# Patient Record
Sex: Female | Born: 1950 | Race: White | Hispanic: No | Marital: Married | Smoking: Never smoker
Health system: Southern US, Community
[De-identification: ages and names within clinical notes are randomized; demographics above are authoritative.]

## PROBLEM LIST (undated history)

## (undated) DIAGNOSIS — J302 Other seasonal allergic rhinitis: Secondary | ICD-10-CM

## (undated) DIAGNOSIS — H269 Unspecified cataract: Secondary | ICD-10-CM

## (undated) DIAGNOSIS — R112 Nausea with vomiting, unspecified: Secondary | ICD-10-CM

## (undated) DIAGNOSIS — E785 Hyperlipidemia, unspecified: Secondary | ICD-10-CM

## (undated) DIAGNOSIS — M810 Age-related osteoporosis without current pathological fracture: Secondary | ICD-10-CM

## (undated) DIAGNOSIS — Z8551 Personal history of malignant neoplasm of bladder: Secondary | ICD-10-CM

## (undated) DIAGNOSIS — Z87442 Personal history of urinary calculi: Secondary | ICD-10-CM

## (undated) DIAGNOSIS — Z9889 Other specified postprocedural states: Secondary | ICD-10-CM

## (undated) DIAGNOSIS — S62309A Unspecified fracture of unspecified metacarpal bone, initial encounter for closed fracture: Secondary | ICD-10-CM

## (undated) DIAGNOSIS — Z8719 Personal history of other diseases of the digestive system: Secondary | ICD-10-CM

## (undated) DIAGNOSIS — Z98811 Dental restoration status: Secondary | ICD-10-CM

## (undated) DIAGNOSIS — T07XXXA Unspecified multiple injuries, initial encounter: Secondary | ICD-10-CM

## (undated) DIAGNOSIS — S52501A Unspecified fracture of the lower end of right radius, initial encounter for closed fracture: Secondary | ICD-10-CM

## (undated) DIAGNOSIS — G43909 Migraine, unspecified, not intractable, without status migrainosus: Secondary | ICD-10-CM

## (undated) HISTORY — PX: ORIF ELBOW FRACTURE: SHX5031

## (undated) HISTORY — PX: DILATION AND CURETTAGE OF UTERUS: SHX78

## (undated) HISTORY — PX: OTHER SURGICAL HISTORY: SHX169

## (undated) HISTORY — PX: ORIF FOOT FRACTURE: SHX2123

---

## 2002-10-30 HISTORY — PX: LAPAROSCOPIC SIGMOID COLECTOMY: SHX5928

## 2006-09-28 ENCOUNTER — Encounter (INDEPENDENT_AMBULATORY_CARE_PROVIDER_SITE_OTHER): Payer: Self-pay | Admitting: Specialist

## 2006-09-28 ENCOUNTER — Ambulatory Visit (HOSPITAL_BASED_OUTPATIENT_CLINIC_OR_DEPARTMENT_OTHER): Admission: RE | Admit: 2006-09-28 | Discharge: 2006-09-28 | Payer: Self-pay | Admitting: Orthopedic Surgery

## 2006-09-28 HISTORY — PX: ELBOW ARTHROSCOPY: SHX614

## 2007-06-18 ENCOUNTER — Encounter: Admission: RE | Admit: 2007-06-18 | Discharge: 2007-06-18 | Payer: Self-pay | Admitting: Obstetrics and Gynecology

## 2008-06-22 ENCOUNTER — Encounter: Admission: RE | Admit: 2008-06-22 | Discharge: 2008-06-22 | Payer: Self-pay | Admitting: Obstetrics and Gynecology

## 2009-01-07 ENCOUNTER — Encounter: Admission: RE | Admit: 2009-01-07 | Discharge: 2009-01-07 | Payer: Self-pay | Admitting: Family Medicine

## 2009-02-01 ENCOUNTER — Encounter: Payer: Self-pay | Admitting: Internal Medicine

## 2009-02-11 ENCOUNTER — Encounter: Payer: Self-pay | Admitting: Internal Medicine

## 2009-02-15 ENCOUNTER — Encounter: Payer: Self-pay | Admitting: Internal Medicine

## 2009-02-24 ENCOUNTER — Encounter: Payer: Self-pay | Admitting: Internal Medicine

## 2009-02-26 ENCOUNTER — Encounter: Payer: Self-pay | Admitting: Internal Medicine

## 2009-03-08 DIAGNOSIS — M810 Age-related osteoporosis without current pathological fracture: Secondary | ICD-10-CM | POA: Insufficient documentation

## 2009-03-09 ENCOUNTER — Ambulatory Visit: Payer: Self-pay | Admitting: Internal Medicine

## 2009-03-09 DIAGNOSIS — R0602 Shortness of breath: Secondary | ICD-10-CM | POA: Insufficient documentation

## 2009-03-09 DIAGNOSIS — R05 Cough: Secondary | ICD-10-CM

## 2009-03-09 DIAGNOSIS — R059 Cough, unspecified: Secondary | ICD-10-CM | POA: Insufficient documentation

## 2009-03-12 ENCOUNTER — Ambulatory Visit (HOSPITAL_COMMUNITY): Admission: RE | Admit: 2009-03-12 | Discharge: 2009-03-12 | Payer: Self-pay | Admitting: Internal Medicine

## 2009-03-12 ENCOUNTER — Encounter: Payer: Self-pay | Admitting: Internal Medicine

## 2009-03-24 ENCOUNTER — Telehealth: Payer: Self-pay | Admitting: Internal Medicine

## 2009-04-02 ENCOUNTER — Telehealth (INDEPENDENT_AMBULATORY_CARE_PROVIDER_SITE_OTHER): Payer: Self-pay | Admitting: *Deleted

## 2009-04-02 ENCOUNTER — Encounter (INDEPENDENT_AMBULATORY_CARE_PROVIDER_SITE_OTHER): Payer: Self-pay | Admitting: *Deleted

## 2009-04-23 ENCOUNTER — Encounter: Payer: Self-pay | Admitting: Internal Medicine

## 2009-04-23 ENCOUNTER — Ambulatory Visit (HOSPITAL_COMMUNITY): Admission: RE | Admit: 2009-04-23 | Discharge: 2009-04-23 | Payer: Self-pay | Admitting: Internal Medicine

## 2009-06-02 ENCOUNTER — Ambulatory Visit: Payer: Self-pay | Admitting: Internal Medicine

## 2009-06-03 ENCOUNTER — Telehealth: Payer: Self-pay | Admitting: Internal Medicine

## 2009-06-03 ENCOUNTER — Encounter: Payer: Self-pay | Admitting: Pulmonary Disease

## 2009-06-23 ENCOUNTER — Encounter: Admission: RE | Admit: 2009-06-23 | Discharge: 2009-06-23 | Payer: Self-pay | Admitting: Obstetrics and Gynecology

## 2009-06-26 ENCOUNTER — Emergency Department (HOSPITAL_COMMUNITY): Admission: EM | Admit: 2009-06-26 | Discharge: 2009-06-26 | Payer: Self-pay | Admitting: Emergency Medicine

## 2009-07-06 ENCOUNTER — Encounter: Admission: RE | Admit: 2009-07-06 | Discharge: 2009-07-06 | Payer: Self-pay | Admitting: Obstetrics and Gynecology

## 2009-10-02 ENCOUNTER — Emergency Department (HOSPITAL_COMMUNITY): Admission: EM | Admit: 2009-10-02 | Discharge: 2009-10-02 | Payer: Self-pay | Admitting: Family Medicine

## 2010-06-24 ENCOUNTER — Encounter: Admission: RE | Admit: 2010-06-24 | Discharge: 2010-06-24 | Payer: Self-pay | Admitting: Obstetrics and Gynecology

## 2010-10-17 ENCOUNTER — Encounter
Admission: RE | Admit: 2010-10-17 | Discharge: 2010-10-17 | Payer: Self-pay | Source: Home / Self Care | Attending: Sports Medicine | Admitting: Sports Medicine

## 2011-01-17 ENCOUNTER — Other Ambulatory Visit: Payer: Self-pay | Admitting: Family Medicine

## 2011-01-17 DIAGNOSIS — K219 Gastro-esophageal reflux disease without esophagitis: Secondary | ICD-10-CM

## 2011-01-20 ENCOUNTER — Ambulatory Visit
Admission: RE | Admit: 2011-01-20 | Discharge: 2011-01-20 | Disposition: A | Discharge status: Home / Self Care | Payer: BC Managed Care – PPO | Source: Ambulatory Visit | Attending: Family Medicine | Admitting: Family Medicine

## 2011-01-20 DIAGNOSIS — K219 Gastro-esophageal reflux disease without esophagitis: Secondary | ICD-10-CM

## 2011-03-17 NOTE — Op Note (Signed)
NAMEMERLINDA, WRUBEL NO.:  [id number]   MEDICAL RECORD NO.:  [id number]          PATIENT TYPE:  AMB   LOCATION:  DSC                          FACILITY:  [facility]   PHYSICIAN:  Artist Pais. 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   PROCEDURE:  Left elbow arthroscopy with debridement of extensor carpi  radialis brevis tendon, radial capitellar joint and removal of large  foreign body from the olecranon fossa posteriorly.   SURGEON:  Cindee Salt, M.D., and Artist Pais. Mina Marble, M.D.   ANESTHESIA:  General.   TOURNIQUET TIME:  Thirty-five minutes.   COMPLICATIONS:  No complications.   DRAINS:  No drains.   OPERATIVE REPORT:  The patient was taken to the operating suite.  After  the induction of adequate general anesthesia, she was placed in the  lateral decubitus position with the left arm up and placed into an elbow  holder.  She was then prepped and draped in the usual, sterile fashion.  At this point in time, a standard anteromedial arthroscopic portal was  established 2 cm from the mid epicondyle anterior to the intramuscular  septum.  Sharp dissection was carried down through the skin.  Blunt  dissection anterior to the intramuscular septum with a trocar placed  into the joint.  Visualization revealed significant radiocapitellar  synovitis.  Prior to this being done, an 18-gauge needle was used to  inflate the joint with normal saline to the soft spot.  Using an inside-  out technique, a lateral portal was established in the area of the  radial head.  A suction shaver was introduced through the joint.  Synovectomy and debridement was undertaken.  At this  point in time, the  extensor carpi radialis brevis tendon and capsule overlying this area  were debrided until the muscle fibers of the ECRB were seen.  This was  done off the capsule and off the epicondylar ridge.  This was done  through two accessory portals that were made using inside-out technique  in the area of the radial head.  Once this was done, a lateral posterior  portal and a direct posterior portal was established.  A scope was  introduced into the lateral posterior portal and instruments into the  direct posterior portal.  The olecranon fossa was identified and  debrided.  A large loose body was seen and removed using a combination  of graspers and a suction shaver.  The lateral gutter was also cleared  of all debris.  There were no loose  bodies.  All the wounds were irrigated.  They were closed with 4-0  nylon.  A sterile dressing of Xeroform, 4 x 4s, fluffs and a posterior  elbow splint was applied with the elbow at 90 degrees and the forearm in  neutral.  The patient tolerated the  procedure well.      Artist Pais Mina Marble, M.D.  Electronically Signed     MAW/MEDQ  D:  09/28/2006  T:  09/29/2006  Job:  [id number]

## 2011-04-06 ENCOUNTER — Ambulatory Visit
Admission: RE | Admit: 2011-04-06 | Discharge: 2011-04-06 | Disposition: A | Discharge status: Home / Self Care | Payer: BC Managed Care – PPO | Source: Ambulatory Visit | Attending: Family Medicine | Admitting: Family Medicine

## 2011-04-06 ENCOUNTER — Other Ambulatory Visit: Payer: Self-pay | Admitting: Family Medicine

## 2011-04-06 DIAGNOSIS — M546 Pain in thoracic spine: Secondary | ICD-10-CM

## 2011-06-23 ENCOUNTER — Other Ambulatory Visit: Payer: Self-pay | Admitting: Obstetrics and Gynecology

## 2011-06-23 DIAGNOSIS — Z1231 Encounter for screening mammogram for malignant neoplasm of breast: Secondary | ICD-10-CM

## 2011-07-05 ENCOUNTER — Ambulatory Visit
Admission: RE | Admit: 2011-07-05 | Discharge: 2011-07-05 | Disposition: A | Discharge status: Home / Self Care | Payer: BC Managed Care – PPO | Source: Ambulatory Visit | Attending: Obstetrics and Gynecology | Admitting: Obstetrics and Gynecology

## 2011-07-05 DIAGNOSIS — Z1231 Encounter for screening mammogram for malignant neoplasm of breast: Secondary | ICD-10-CM

## 2011-07-11 ENCOUNTER — Other Ambulatory Visit: Payer: Self-pay | Admitting: Obstetrics

## 2011-07-11 DIAGNOSIS — M810 Age-related osteoporosis without current pathological fracture: Secondary | ICD-10-CM

## 2011-07-18 ENCOUNTER — Ambulatory Visit
Admission: RE | Admit: 2011-07-18 | Discharge: 2011-07-18 | Disposition: A | Discharge status: Home / Self Care | Payer: BC Managed Care – PPO | Source: Ambulatory Visit | Attending: Obstetrics | Admitting: Obstetrics

## 2011-07-18 DIAGNOSIS — M810 Age-related osteoporosis without current pathological fracture: Secondary | ICD-10-CM

## 2011-10-20 ENCOUNTER — Other Ambulatory Visit: Payer: Self-pay | Admitting: Family Medicine

## 2011-10-20 DIAGNOSIS — J3489 Other specified disorders of nose and nasal sinuses: Secondary | ICD-10-CM

## 2011-10-25 ENCOUNTER — Ambulatory Visit
Admission: RE | Admit: 2011-10-25 | Discharge: 2011-10-25 | Disposition: A | Discharge status: Home / Self Care | Payer: BC Managed Care – PPO | Source: Ambulatory Visit | Attending: Family Medicine | Admitting: Family Medicine

## 2011-10-25 DIAGNOSIS — J3489 Other specified disorders of nose and nasal sinuses: Secondary | ICD-10-CM

## 2012-02-27 ENCOUNTER — Ambulatory Visit
Admission: RE | Admit: 2012-02-27 | Discharge: 2012-02-27 | Disposition: A | Discharge status: Home / Self Care | Payer: BC Managed Care – PPO | Source: Ambulatory Visit | Attending: Allergy and Immunology | Admitting: Allergy and Immunology

## 2012-02-27 ENCOUNTER — Other Ambulatory Visit: Payer: Self-pay | Admitting: Allergy and Immunology

## 2012-02-27 DIAGNOSIS — R059 Cough, unspecified: Secondary | ICD-10-CM

## 2012-02-27 DIAGNOSIS — R05 Cough: Secondary | ICD-10-CM

## 2012-05-29 ENCOUNTER — Other Ambulatory Visit: Payer: Self-pay | Admitting: Obstetrics and Gynecology

## 2012-05-29 DIAGNOSIS — Z1231 Encounter for screening mammogram for malignant neoplasm of breast: Secondary | ICD-10-CM

## 2012-07-08 ENCOUNTER — Ambulatory Visit
Admission: RE | Admit: 2012-07-08 | Discharge: 2012-07-08 | Disposition: A | Discharge status: Home / Self Care | Payer: BC Managed Care – PPO | Source: Ambulatory Visit | Attending: Obstetrics and Gynecology | Admitting: Obstetrics and Gynecology

## 2012-07-08 DIAGNOSIS — Z1231 Encounter for screening mammogram for malignant neoplasm of breast: Secondary | ICD-10-CM

## 2012-07-29 ENCOUNTER — Other Ambulatory Visit: Payer: Self-pay | Admitting: Family Medicine

## 2012-07-29 ENCOUNTER — Ambulatory Visit
Admission: RE | Admit: 2012-07-29 | Discharge: 2012-07-29 | Disposition: A | Discharge status: Home / Self Care | Payer: BC Managed Care – PPO | Source: Ambulatory Visit | Attending: Family Medicine | Admitting: Family Medicine

## 2012-07-29 DIAGNOSIS — R319 Hematuria, unspecified: Secondary | ICD-10-CM

## 2012-10-24 ENCOUNTER — Ambulatory Visit
Admission: RE | Admit: 2012-10-24 | Discharge: 2012-10-24 | Disposition: A | Discharge status: Home / Self Care | Payer: BC Managed Care – PPO | Source: Ambulatory Visit | Attending: Family Medicine | Admitting: Family Medicine

## 2012-10-24 ENCOUNTER — Other Ambulatory Visit: Payer: Self-pay | Admitting: Family Medicine

## 2012-10-24 DIAGNOSIS — M6520 Calcific tendinitis, unspecified site: Secondary | ICD-10-CM

## 2012-10-30 DIAGNOSIS — Z8551 Personal history of malignant neoplasm of bladder: Secondary | ICD-10-CM

## 2012-10-30 HISTORY — DX: Personal history of malignant neoplasm of bladder: Z85.51

## 2013-03-31 ENCOUNTER — Ambulatory Visit
Admission: RE | Admit: 2013-03-31 | Discharge: 2013-03-31 | Disposition: A | Discharge status: Home / Self Care | Payer: BC Managed Care – PPO | Source: Ambulatory Visit | Attending: Family Medicine | Admitting: Family Medicine

## 2013-03-31 ENCOUNTER — Other Ambulatory Visit: Payer: Self-pay | Admitting: Family Medicine

## 2013-03-31 DIAGNOSIS — R519 Headache, unspecified: Secondary | ICD-10-CM

## 2013-06-04 ENCOUNTER — Other Ambulatory Visit: Payer: Self-pay | Admitting: Urology

## 2013-06-05 ENCOUNTER — Encounter (HOSPITAL_COMMUNITY): Payer: Self-pay | Admitting: Pharmacy Technician

## 2013-06-13 ENCOUNTER — Encounter (HOSPITAL_COMMUNITY)
Admission: RE | Admit: 2013-06-13 | Discharge: 2013-06-13 | Disposition: A | Discharge status: Home / Self Care | Payer: BC Managed Care – PPO | Source: Ambulatory Visit | Attending: Urology | Admitting: Urology

## 2013-06-13 ENCOUNTER — Encounter (HOSPITAL_COMMUNITY): Payer: Self-pay

## 2013-06-13 DIAGNOSIS — Z01812 Encounter for preprocedural laboratory examination: Secondary | ICD-10-CM | POA: Insufficient documentation

## 2013-06-13 LAB — CBC
Hemoglobin: 14.4 g/dL (ref 12.0–15.0)
MCH: 30.8 pg (ref 26.0–34.0)
RBC: 4.68 MIL/uL (ref 3.87–5.11)

## 2013-06-13 NOTE — Pre-Procedure Instructions (Signed)
PT'S SLEEP STUDY REPORT ON HER CHART FROM [facility] - DONE 05/30/2012. PT'S ECHO REPORT 02/01/09, NUCLEAR STRESS TEST REPORT 02/15/09, HEART MONITOR STUDY 2010, AND CARDIOLOGY OFFICE NOTES DR. Anne Fu 02/11/09 ON PT'S CHART. EKG AND CXR NOT NEEDED PER ANESTHESIOLOGIST'S GUIDELINES.

## 2013-06-13 NOTE — Patient Instructions (Signed)
YOUR SURGERY IS SCHEDULED AT [facility]  ON:  Thursday  8/21  REPORT TO [facility] AT:  5:30 AM      PHONE # FOR SHORT STAY IS [phone]  DO NOT EAT OR DRINK ANYTHING AFTER MIDNIGHT THE NIGHT BEFORE YOUR SURGERY.  YOU MAY BRUSH YOUR TEETH, RINSE OUT YOUR MOUTH--BUT NO WATER, NO FOOD, NO CHEWING GUM, NO MINTS, NO CANDIES, NO CHEWING TOBACCO.  PLEASE TAKE THE FOLLOWING MEDICATIONS THE AM OF YOUR SURGERY WITH A FEW SIPS OF WATER:  NO MEDICINES TO TAKE   DO NOT BRING VALUABLES, MONEY, CREDIT CARDS.  DO NOT WEAR JEWELRY, MAKE-UP, NAIL POLISH AND NO METAL PINS OR CLIPS IN YOUR HAIR. CONTACT LENS, DENTURES / PARTIALS, GLASSES SHOULD NOT BE WORN TO SURGERY AND IN MOST CASES-HEARING AIDS WILL NEED TO BE REMOVED.  BRING YOUR GLASSES CASE, ANY EQUIPMENT NEEDED FOR YOUR CONTACT LENS. FOR PATIENTS ADMITTED TO THE HOSPITAL--CHECK OUT TIME THE DAY OF DISCHARGE IS 11:00 AM.  ALL INPATIENT ROOMS ARE PRIVATE - WITH BATHROOM, TELEPHONE, TELEVISION AND WIFI INTERNET.  IF YOU ARE BEING DISCHARGED THE SAME DAY OF YOUR SURGERY--YOU CAN NOT DRIVE YOURSELF HOME--AND SHOULD NOT GO HOME ALONE BY TAXI OR BUS.  NO DRIVING OR OPERATING MACHINERY FOR 24 HOURS FOLLOWING ANESTHESIA / PAIN MEDICATIONS.  PLEASE MAKE ARRANGEMENTS FOR SOMEONE TO BE WITH YOU AT HOME THE FIRST 24 HOURS AFTER SURGERY. RESPONSIBLE DRIVER'S NAME  HARRY Venturini - PT'S HUSBAND                                               PHONE #  [phone]                             FAILURE TO FOLLOW THESE INSTRUCTIONS MAY RESULT IN THE CANCELLATION OF YOUR SURGERY.   PATIENT SIGNATURE_________________________________

## 2013-06-18 NOTE — Anesthesia Preprocedure Evaluation (Addendum)
Anesthesia Evaluation  Patient identified by MRN, date of birth, ID band Patient awake    Reviewed: Allergy & Precautions, H&P , NPO status , Patient's Chart, lab work & pertinent test results  Airway Mallampati: II TM Distance: >3 FB Neck ROM: Full    Dental  (+) Teeth Intact and Dental Advisory Given   Pulmonary shortness of breath,  breath sounds clear to auscultation  Pulmonary exam normal       Cardiovascular negative cardio ROS  Rate:Normal     Neuro/Psych  Headaches, negative psych ROS   GI/Hepatic negative GI ROS, Neg liver ROS,   Endo/Other  negative endocrine ROS  Renal/GU negative Renal ROS  negative genitourinary   Musculoskeletal negative musculoskeletal ROS (+)   Abdominal   Peds negative pediatric ROS (+)  Hematology negative hematology ROS (+)   Anesthesia Other Findings   Reproductive/Obstetrics                          Anesthesia Physical Anesthesia Plan  ASA: II  Anesthesia Plan: General   Post-op Pain Management:    Induction: Intravenous  Airway Management Planned: Oral ETT  Additional Equipment:   Intra-op Plan:   Post-operative Plan: Extubation in OR  Informed Consent: I have reviewed the patients History and Physical, chart, labs and discussed the procedure including the risks, benefits and alternatives for the proposed anesthesia with the patient or authorized representative who has indicated his/her understanding and acceptance.     Plan Discussed with: CRNA  Anesthesia Plan Comments:         Anesthesia Quick Evaluation

## 2013-06-19 ENCOUNTER — Encounter (HOSPITAL_COMMUNITY): Payer: Self-pay | Admitting: *Deleted

## 2013-06-19 ENCOUNTER — Ambulatory Visit (HOSPITAL_COMMUNITY): Payer: BC Managed Care – PPO

## 2013-06-19 ENCOUNTER — Encounter (HOSPITAL_COMMUNITY)
Admission: RE | Disposition: A | Discharge status: Home / Self Care | Payer: Self-pay | Source: Ambulatory Visit | Attending: Urology

## 2013-06-19 ENCOUNTER — Ambulatory Visit (HOSPITAL_COMMUNITY)
Admission: RE | Admit: 2013-06-19 | Discharge: 2013-06-19 | Disposition: A | Discharge status: Home / Self Care | Payer: BC Managed Care – PPO | Source: Ambulatory Visit | Attending: Urology | Admitting: Urology

## 2013-06-19 ENCOUNTER — Ambulatory Visit (HOSPITAL_COMMUNITY): Payer: BC Managed Care – PPO | Admitting: Anesthesiology

## 2013-06-19 ENCOUNTER — Encounter (HOSPITAL_COMMUNITY): Payer: Self-pay | Admitting: Anesthesiology

## 2013-06-19 DIAGNOSIS — Z9049 Acquired absence of other specified parts of digestive tract: Secondary | ICD-10-CM | POA: Insufficient documentation

## 2013-06-19 DIAGNOSIS — E7800 Pure hypercholesterolemia, unspecified: Secondary | ICD-10-CM | POA: Insufficient documentation

## 2013-06-19 DIAGNOSIS — Z79899 Other long term (current) drug therapy: Secondary | ICD-10-CM | POA: Insufficient documentation

## 2013-06-19 DIAGNOSIS — Z883 Allergy status to other anti-infective agents status: Secondary | ICD-10-CM | POA: Insufficient documentation

## 2013-06-19 DIAGNOSIS — C679 Malignant neoplasm of bladder, unspecified: Secondary | ICD-10-CM | POA: Insufficient documentation

## 2013-06-19 DIAGNOSIS — Z885 Allergy status to narcotic agent status: Secondary | ICD-10-CM | POA: Insufficient documentation

## 2013-06-19 DIAGNOSIS — M47817 Spondylosis without myelopathy or radiculopathy, lumbosacral region: Secondary | ICD-10-CM | POA: Insufficient documentation

## 2013-06-19 DIAGNOSIS — G43909 Migraine, unspecified, not intractable, without status migrainosus: Secondary | ICD-10-CM | POA: Insufficient documentation

## 2013-06-19 DIAGNOSIS — D494 Neoplasm of unspecified behavior of bladder: Secondary | ICD-10-CM

## 2013-06-19 DIAGNOSIS — Z882 Allergy status to sulfonamides status: Secondary | ICD-10-CM | POA: Insufficient documentation

## 2013-06-19 DIAGNOSIS — N35919 Unspecified urethral stricture, male, unspecified site: Secondary | ICD-10-CM | POA: Insufficient documentation

## 2013-06-19 HISTORY — PX: TRANSURETHRAL RESECTION OF BLADDER TUMOR WITH GYRUS (TURBT-GYRUS): SHX6458

## 2013-06-19 HISTORY — PX: CYSTOSCOPY W/ RETROGRADES: SHX1426

## 2013-06-19 SURGERY — TRANSURETHRAL RESECTION OF BLADDER TUMOR WITH GYRUS (TURBT-GYRUS)
Anesthesia: General | Wound class: Clean Contaminated

## 2013-06-19 MED ORDER — BELLADONNA ALKALOIDS-OPIUM 16.2-60 MG RE SUPP
RECTAL | Status: AC
  Filled 2013-06-19: qty 1

## 2013-06-19 MED ORDER — BELLADONNA ALKALOIDS-OPIUM 16.2-60 MG RE SUPP
RECTAL | Status: DC | PRN
  Administered 2013-06-19: 1 via RECTAL

## 2013-06-19 MED ORDER — DIATRIZOATE MEGLUMINE 30 % UR SOLN
URETHRAL | Status: DC | PRN
  Administered 2013-06-19: 300 mL via URETHRAL

## 2013-06-19 MED ORDER — SODIUM CHLORIDE 0.9 % IR SOLN
Status: DC | PRN
  Administered 2013-06-19: 6000 mL via INTRAVESICAL

## 2013-06-19 MED ORDER — FENTANYL CITRATE 0.05 MG/ML IJ SOLN
INTRAMUSCULAR | Status: AC
  Filled 2013-06-19: qty 2

## 2013-06-19 MED ORDER — CEPHALEXIN 500 MG PO CAPS: 500.0000 mg | ORAL_CAPSULE | Freq: Three times a day (TID) | ORAL | Status: DC

## 2013-06-19 MED ORDER — LIDOCAINE HCL 2 % EX GEL
CUTANEOUS | Status: DC | PRN
  Administered 2013-06-19: 1 via URETHRAL

## 2013-06-19 MED ORDER — MIDAZOLAM HCL 5 MG/5ML IJ SOLN
INTRAMUSCULAR | Status: DC | PRN
  Administered 2013-06-19: 2 mg via INTRAVENOUS

## 2013-06-19 MED ORDER — IOHEXOL 300 MG/ML  SOLN
INTRAMUSCULAR | Status: DC | PRN
  Administered 2013-06-19: 10 mL

## 2013-06-19 MED ORDER — PHENAZOPYRIDINE HCL 100 MG PO TABS
100.0000 mg | ORAL_TABLET | Freq: Three times a day (TID) | ORAL | Status: DC
  Administered 2013-06-19: 100 mg via ORAL
  Filled 2013-06-19 (×5): qty 1

## 2013-06-19 MED ORDER — ONDANSETRON HCL 4 MG/2ML IJ SOLN
INTRAMUSCULAR | Status: DC | PRN
  Administered 2013-06-19: 4 mg via INTRAVENOUS

## 2013-06-19 MED ORDER — SENNOSIDES-DOCUSATE SODIUM 8.6-50 MG PO TABS: 1.0000 | ORAL_TABLET | Freq: Two times a day (BID) | ORAL | Status: DC

## 2013-06-19 MED ORDER — HYOSCYAMINE SULFATE 0.125 MG PO TABS: 0.1250 mg | ORAL_TABLET | ORAL | Status: DC | PRN

## 2013-06-19 MED ORDER — CEFAZOLIN SODIUM-DEXTROSE 2-3 GM-% IV SOLR
INTRAVENOUS | Status: AC
  Filled 2013-06-19: qty 50

## 2013-06-19 MED ORDER — IOHEXOL 300 MG/ML  SOLN
INTRAMUSCULAR | Status: AC
  Filled 2013-06-19: qty 1

## 2013-06-19 MED ORDER — CEFAZOLIN SODIUM-DEXTROSE 2-3 GM-% IV SOLR
2.0000 g | INTRAVENOUS | Status: AC
  Administered 2013-06-19: 2 g via INTRAVENOUS

## 2013-06-19 MED ORDER — FENTANYL CITRATE 0.05 MG/ML IJ SOLN
25.0000 ug | INTRAMUSCULAR | Status: DC | PRN
  Administered 2013-06-19 (×4): 25 ug via INTRAVENOUS

## 2013-06-19 MED ORDER — PHENAZOPYRIDINE HCL 100 MG PO TABS: 100.0000 mg | ORAL_TABLET | Freq: Three times a day (TID) | ORAL | Status: DC | PRN

## 2013-06-19 MED ORDER — OXYCODONE-ACETAMINOPHEN 5-325 MG PO TABS: 1.0000 | ORAL_TABLET | ORAL | Status: DC | PRN

## 2013-06-19 MED ORDER — FENTANYL CITRATE 0.05 MG/ML IJ SOLN
INTRAMUSCULAR | Status: DC | PRN
  Administered 2013-06-19 (×4): 50 ug via INTRAVENOUS

## 2013-06-19 MED ORDER — LIDOCAINE HCL 2 % EX GEL
CUTANEOUS | Status: AC
  Filled 2013-06-19: qty 10

## 2013-06-19 MED ORDER — PROPOFOL 10 MG/ML IV BOLUS
INTRAVENOUS | Status: DC | PRN
  Administered 2013-06-19: 120 mg via INTRAVENOUS

## 2013-06-19 MED ORDER — OXYCODONE-ACETAMINOPHEN 5-325 MG PO TABS
1.0000 | ORAL_TABLET | ORAL | Status: DC | PRN
  Administered 2013-06-19: 1 via ORAL
  Filled 2013-06-19: qty 1

## 2013-06-19 MED ORDER — NEOSTIGMINE METHYLSULFATE 1 MG/ML IJ SOLN
INTRAMUSCULAR | Status: DC | PRN
  Administered 2013-06-19: 3 mg via INTRAVENOUS

## 2013-06-19 MED ORDER — OXYBUTYNIN CHLORIDE 5 MG PO TABS: 5.0000 mg | ORAL_TABLET | Freq: Four times a day (QID) | ORAL | Status: DC | PRN

## 2013-06-19 MED ORDER — LACTATED RINGERS IV SOLN: INTRAVENOUS | Status: DC

## 2013-06-19 MED ORDER — SUCCINYLCHOLINE CHLORIDE 20 MG/ML IJ SOLN
INTRAMUSCULAR | Status: DC | PRN
  Administered 2013-06-19: 100 mg via INTRAVENOUS

## 2013-06-19 MED ORDER — LIDOCAINE HCL (CARDIAC) 20 MG/ML IV SOLN
INTRAVENOUS | Status: DC | PRN
  Administered 2013-06-19: 100 mg via INTRAVENOUS

## 2013-06-19 MED ORDER — GLYCOPYRROLATE 0.2 MG/ML IJ SOLN
INTRAMUSCULAR | Status: DC | PRN
  Administered 2013-06-19: 0.4 mg via INTRAVENOUS

## 2013-06-19 MED ORDER — ROCURONIUM BROMIDE 100 MG/10ML IV SOLN
INTRAVENOUS | Status: DC | PRN
  Administered 2013-06-19: 20 mg via INTRAVENOUS

## 2013-06-19 MED ORDER — DEXAMETHASONE SODIUM PHOSPHATE 10 MG/ML IJ SOLN
INTRAMUSCULAR | Status: DC | PRN
  Administered 2013-06-19: 10 mg via INTRAVENOUS

## 2013-06-19 MED ORDER — LACTATED RINGERS IV SOLN
INTRAVENOUS | Status: DC | PRN
  Administered 2013-06-19 (×2): via INTRAVENOUS

## 2013-06-19 MED ORDER — PROMETHAZINE HCL 25 MG/ML IJ SOLN: 6.2500 mg | INTRAMUSCULAR | Status: DC | PRN

## 2013-06-19 SURGICAL SUPPLY — 18 items
BAG URINE DRAINAGE (UROLOGICAL SUPPLIES) ×3 IMPLANT
BAG URO CATCHER STRL LF (DRAPE) ×3 IMPLANT
BASKET ZERO TIP NITINOL 2.4FR (BASKET) IMPLANT
CATH FOLEY 2WAY SLVR  5CC 20FR (CATHETERS) ×1
CATH FOLEY 2WAY SLVR 5CC 20FR (CATHETERS) ×2 IMPLANT
CATH URET 5FR 28IN OPEN ENDED (CATHETERS) ×3 IMPLANT
CLOTH BEACON ORANGE TIMEOUT ST (SAFETY) ×3 IMPLANT
DRAPE CAMERA CLOSED 9X96 (DRAPES) ×3 IMPLANT
ELECT LOOP MED HF 24F 12D CBL (CLIP) ×3 IMPLANT
GLOVE BIOGEL M 7.0 STRL (GLOVE) IMPLANT
GOWN STRL NON-REIN LRG LVL3 (GOWN DISPOSABLE) ×3 IMPLANT
GUIDEWIRE ANG ZIPWIRE 038X150 (WIRE) IMPLANT
GUIDEWIRE STR DUAL SENSOR (WIRE) ×3 IMPLANT
MANIFOLD NEPTUNE II (INSTRUMENTS) ×3 IMPLANT
PACK CYSTO (CUSTOM PROCEDURE TRAY) ×3 IMPLANT
SCRUB PCMX 4 OZ (MISCELLANEOUS) IMPLANT
SYRINGE IRR TOOMEY STRL 70CC (SYRINGE) ×3 IMPLANT
TUBING CONNECTING 10 (TUBING) ×3 IMPLANT

## 2013-06-19 NOTE — Op Note (Signed)
Urology Operative Report  Date of Procedure: 06/19/13  Surgeon: Natalia Leatherwood, MD Assistant:  None  Preoperative Diagnosis: Bladder tumor. Postoperative Diagnosis:  Same  Procedure(s): Transurethral resection of bladder tumor (small, <2 cm), multiple. Bilateral retrograde pyelograms. Gravity cystogram with interpretation. Urethral calibration/dilation. Cystoscopy.  Estimated blood loss: Minimal  Specimen: Bladder tumor specimen sent along with a deeper biopsy of the bladder wall.  Drains: 20 French Foley catheter  Complications: None  Findings: Multiple bladder tumors throughout the bladder mostly involving the right bladder wall and right bladder floor. There were over 15 different tumors. The largest was less than 2 cm in size.  Bilateral retrograde pyelogram's negative for filling defects.  On table cystogram revealed extraperitoneal extravasation indicating a small extra peritoneal bladder perforation.  History of present illness: 62 year-old female presents today for transurethral resection of bladder tumor found during workup for gross hematuria. This is her first episode of bladder tumors.   Procedure in detail: After informed consent was obtained, the patient was taken to the operating room. They were placed in the supine position. SCDs were turned on and in place. IV antibiotics were infused, and general anesthesia was induced. A timeout was performed in which the correct patient, surgical site, and procedure were identified and agreed upon by the team.  The patient was placed in a dorsolithotomy position, making sure to pad all pertinent neurovascular pressure points. A belladonna and opium suppository was placed into the rectum. The genitals were prepped and draped in the usual sterile fashion.  Attempt to place a rigid cystoscope was unsuccessful due to stenosis of the urethra. This had been encountered and the clinic previously. There is no physiologic  obstruction. I then used female urethral sounds beginning at 35 [place] and dilating up to 80 [place] with ease. A rigid cystoscope was then advanced the urethra and into the bladder. The bladder was fully distended and evaluated in a systematic fashion with a 30 and 70 lens. There were noted to be bladder tumors mostly involving the right lateral bladder wall and bladder floor. There is also some bladder tumor located at the bladder neck at the 8 to 9:00 position. Most of these bladder tumors were small. The largest was less than 2 cm in size.  Attention was turned to the right and left ureter orifices. These were free of tumor. They were cannulated with a 5 French ureter catheter and I injected contrast to obtain retrograde pyelograms. There was no dilation, filling defects, and it was indeed out well on delayed films.  I then removed the cystoscope and inserted the visual obturator to the gyrus resectoscope. I carried out resection in normal saline. I systematically removed the bladder tumors and sent these in 2 separate specimens for pathology. Next I used a cold cup biopsy forcep to sample the deeper tissue to obtain muscle specimen. Because I obtained deeper tissue I felt that it would be most appropriate to obtain a cystogram to ensure there is no extravasation.  The tumor at the bladder neck was very small and punctate and this was fulgurated with the gyrus.  Bladder tumor chips were removed and then I placed 10 cc of lidocaine jelly into the urethra. I then inserted a 20 French Foley catheter and inflated the balloon with 10 cc of water. 3 contrast films were taken which showed no contrast in the bladder. I then used gravity filled the bladder with over 400 cc of Cystografin mixed with normal saline. A second film was taken  which showed no extravasation of contrast around the bladder. The bladder was then drained and gently irrigated with normal saline. A third film was obtained which showed some  remaining contrast most likely from a small extraperitoneal perforation. Because of this I elected to leave the catheter in place.  She's placed back in a supine position, anesthesia was reversed, and she was taken to the PACU in stable condition.  She will be given antibiotics to take all her catheter is in place and she will have to cystogram and followup.  All counts were correct at the end of the case.

## 2013-06-19 NOTE — Progress Notes (Signed)
Foley connected to leg bag and patient and husband instructed on use

## 2013-06-19 NOTE — Transfer of Care (Signed)
Immediate Anesthesia Transfer of Care Note  Patient: [name]  Procedure(s) Performed: Procedure(s): TRANSURETHRAL RESECTION OF BLADDER TUMOR WITH GYRUS (TURBT-GYRUS) (N/A) CYSTOSCOPY WITH RETROGRADE PYELOGRAM (Bilateral)  Patient Location: PACU  Anesthesia Type:General  Level of Consciousness: sedated  Airway & Oxygen Therapy: Patient Spontanous Breathing and Patient connected to face mask oxygen  Post-op Assessment: Report given to PACU RN and Post -op Vital signs reviewed and stable  Post vital signs: Reviewed and stable  Complications: No apparent anesthesia complications

## 2013-06-19 NOTE — Anesthesia Postprocedure Evaluation (Signed)
Anesthesia Post Note  Patient: [name] Kitchen  Procedure(s) Performed: Procedure(s) (LRB): TRANSURETHRAL RESECTION OF BLADDER TUMOR WITH GYRUS (TURBT-GYRUS) (N/A) CYSTOSCOPY WITH RETROGRADE PYELOGRAM (Bilateral)  Anesthesia type: General  Patient location: PACU  Post pain: Pain level controlled  Post assessment: Post-op Vital signs reviewed  Last Vitals:  Filed Vitals:   06/19/13 1019  BP: 135/79  Pulse: 43  Temp: 36.4 C  Resp: 12    Post vital signs: Reviewed  Level of consciousness: sedated  Complications: No apparent anesthesia complications

## 2013-06-19 NOTE — H&P (Signed)
Urology History and Physical Exam  CC: Bladder tumors.  HPI:  62 year old female presents today for a bladder tumor.  This was discovered during workup for gross hematuria.  She had a CT hematuria protocol 05/30/13 which revealed thickening of the posterior bladder wall.  Cystoscopy revealed several tumors in her bladder.  There were at least 4-5 smaller tumors with one of them being at least 1 cm in size.  It is possible that these could be larger.  It was difficult to fully visualize the patient's right ureteral orifice due to discomfort from cystoscopy.  The tumors were papillary in nature.  This is her first episode of bladder tumors.  She presents today for cystoscopy, transurethral resection of bladder tumor, and bilateral retrograde pyelograms.  We have discussed the risks, benefits, alternatives, and likelihood of achieving goals.  UA 06/02/13 was negative for signs of infection.  PMH: Past Medical History  Diagnosis Date  . Elevated cholesterol   . Bladder tumor     HEMATURIA - UROLOGICAL WORK UP - FOUND TO HAVE BLADDER TUMORS AND KIDNEY STONE  . Headache(784.0)     MIGRAINES  . Arthritis     LUMBAR SPINE / BACK PAIN    PSH: Past Surgical History  Procedure Laterality Date  . C-sections      X 1  . Orif left elbow    . 2nd left elbow surgery to remove bone chips    . Left shoulder surgery to repair torn ligament    . Orif left foot    . Colon surgery      SIGMOID COLECTOMY  FOR DIVERTICULITS  . Blepharoplasty - bilateral    . Dilation and curettage of uterus      Allergies: Allergies  Allergen Reactions  . Hydrocodone-Acetaminophen     REACTION: nightmares  . Moxifloxacin     REACTION: rash, itching  . Pseudoephedrine     REACTION: makes heart race  . Sulfonamide Derivatives     REACTION: hives    Medications: Prescriptions prior to admission  Medication Sig Dispense Refill  . atorvastatin (LIPITOR) 10 MG tablet Take 10 mg by mouth every other day.      .  ibuprofen (ADVIL,MOTRIN) 200 MG tablet Take 400 mg by mouth every 6 (six) hours as needed for pain.      Marland Kitchen PRESCRIPTION MEDICATION PT TAKES ALLERGY SHOTS ONCE A WEEK AT [facility]      . zolpidem (AMBIEN) 10 MG tablet Take 5 mg by mouth at bedtime.         Social History: History   Social History  . Marital Status: Married    Spouse Name: N/A    Number of Children: N/A  . Years of Education: N/A   Occupational History  . Not on file.   Social History Main Topics  . Smoking status: Never Smoker   . Smokeless tobacco: Never Used  . Alcohol Use: No  . Drug Use: No  . Sexual Activity: Not on file   Other Topics Concern  . Not on file   Social History Narrative  . No narrative on file    Family History: History reviewed. No pertinent family history.  Review of Systems: Positive: None. Negative: Chest pain, nausea, or fever.  A further 10 point review of systems was negative except what is listed in the HPI.  Physical Exam: Filed Vitals:   06/19/13 0532  BP: 112/73  Pulse: 58  Temp: 97.4 F (36.3 C)  Resp:  16    General: No acute distress.  Awake. Head:  Normocephalic.  Atraumatic. ENT:  EOMI.  Mucous membranes moist Neck:  Supple.  No lymphadenopathy. CV:  S1 present. S2 present. Regular rate. Pulmonary: Equal effort bilaterally.  Clear to auscultation bilaterally. Abdomen: Soft.  Non- tender to palpation. Skin:  Normal turgor.  No visible rash. Extremity: No gross deformity of bilateral upper extremities.  No gross deformity of    bilateral lower extremities. Neurologic: Alert. Appropriate mood.    Studies:  No results found for this basename: HGB, WBC, PLT,  in the last 72 hours  No results found for this basename: NA, K, CL, CO2, BUN, CREATININE, CALCIUM, MAGNESIUM, GFRNONAA, GFRAA,  in the last 72 hours   No results found for this basename: PT, INR, APTT,  in the last 72 hours   No components found with this basename: ABG,      Assessment:  Bladder tumors.  Plan: To OR for cystoscopy, transurethral resection of bladder tumor, and bilateral retrograde pyelograms.

## 2013-06-20 ENCOUNTER — Encounter (HOSPITAL_COMMUNITY): Payer: Self-pay | Admitting: Urology

## 2013-07-03 ENCOUNTER — Other Ambulatory Visit: Payer: Self-pay

## 2013-07-03 DIAGNOSIS — Z1231 Encounter for screening mammogram for malignant neoplasm of breast: Secondary | ICD-10-CM

## 2013-07-22 ENCOUNTER — Ambulatory Visit
Admission: RE | Admit: 2013-07-22 | Discharge: 2013-07-22 | Disposition: A | Discharge status: Home / Self Care | Payer: BC Managed Care – PPO | Source: Ambulatory Visit

## 2013-07-22 DIAGNOSIS — Z1231 Encounter for screening mammogram for malignant neoplasm of breast: Secondary | ICD-10-CM

## 2013-07-24 ENCOUNTER — Other Ambulatory Visit: Payer: Self-pay | Admitting: Obstetrics and Gynecology

## 2013-07-24 DIAGNOSIS — R928 Other abnormal and inconclusive findings on diagnostic imaging of breast: Secondary | ICD-10-CM

## 2013-07-29 ENCOUNTER — Other Ambulatory Visit: Payer: Self-pay | Admitting: Obstetrics and Gynecology

## 2013-07-29 DIAGNOSIS — Z780 Asymptomatic menopausal state: Secondary | ICD-10-CM

## 2013-07-29 DIAGNOSIS — M8580 Other specified disorders of bone density and structure, unspecified site: Secondary | ICD-10-CM

## 2013-08-07 ENCOUNTER — Ambulatory Visit
Admission: RE | Admit: 2013-08-07 | Discharge: 2013-08-07 | Disposition: A | Discharge status: Home / Self Care | Payer: BC Managed Care – PPO | Source: Ambulatory Visit | Attending: Obstetrics and Gynecology | Admitting: Obstetrics and Gynecology

## 2013-08-07 DIAGNOSIS — R928 Other abnormal and inconclusive findings on diagnostic imaging of breast: Secondary | ICD-10-CM

## 2013-09-05 ENCOUNTER — Ambulatory Visit
Admission: RE | Admit: 2013-09-05 | Discharge: 2013-09-05 | Disposition: A | Discharge status: Home / Self Care | Payer: BC Managed Care – PPO | Source: Ambulatory Visit | Attending: Obstetrics and Gynecology | Admitting: Obstetrics and Gynecology

## 2013-09-05 DIAGNOSIS — M8580 Other specified disorders of bone density and structure, unspecified site: Secondary | ICD-10-CM

## 2013-09-05 DIAGNOSIS — Z780 Asymptomatic menopausal state: Secondary | ICD-10-CM

## 2013-11-17 ENCOUNTER — Other Ambulatory Visit: Payer: Self-pay | Admitting: Family Medicine

## 2013-11-17 DIAGNOSIS — R109 Unspecified abdominal pain: Secondary | ICD-10-CM

## 2013-11-17 DIAGNOSIS — Z8551 Personal history of malignant neoplasm of bladder: Secondary | ICD-10-CM

## 2013-11-18 ENCOUNTER — Ambulatory Visit
Admission: RE | Admit: 2013-11-18 | Discharge: 2013-11-18 | Disposition: A | Discharge status: Home / Self Care | Payer: BC Managed Care – PPO | Source: Ambulatory Visit | Attending: Family Medicine | Admitting: Family Medicine

## 2013-11-18 DIAGNOSIS — R109 Unspecified abdominal pain: Secondary | ICD-10-CM

## 2013-11-18 DIAGNOSIS — Z8551 Personal history of malignant neoplasm of bladder: Secondary | ICD-10-CM

## 2013-11-18 MED ORDER — IOHEXOL 300 MG/ML  SOLN
100.0000 mL | Freq: Once | INTRAMUSCULAR | Status: AC | PRN
  Administered 2013-11-18: 100 mL via INTRAVENOUS

## 2013-11-30 ENCOUNTER — Emergency Department (HOSPITAL_COMMUNITY): Payer: BC Managed Care – PPO

## 2013-11-30 ENCOUNTER — Emergency Department (HOSPITAL_COMMUNITY)
Admission: EM | Admit: 2013-11-30 | Discharge: 2013-11-30 | Disposition: A | Discharge status: Home / Self Care | Payer: BC Managed Care – PPO | Attending: Emergency Medicine | Admitting: Emergency Medicine

## 2013-11-30 ENCOUNTER — Encounter (HOSPITAL_COMMUNITY): Payer: Self-pay | Admitting: Emergency Medicine

## 2013-11-30 ENCOUNTER — Inpatient Hospital Stay (HOSPITAL_COMMUNITY)
Admission: EM | Admit: 2013-11-30 | Discharge: 2013-12-02 | DRG: 694 | Disposition: A | Discharge status: Home / Self Care | Payer: BC Managed Care – PPO | Attending: Internal Medicine | Admitting: Internal Medicine

## 2013-11-30 DIAGNOSIS — E785 Hyperlipidemia, unspecified: Secondary | ICD-10-CM | POA: Diagnosis present

## 2013-11-30 DIAGNOSIS — E7800 Pure hypercholesterolemia, unspecified: Secondary | ICD-10-CM | POA: Insufficient documentation

## 2013-11-30 DIAGNOSIS — Z8679 Personal history of other diseases of the circulatory system: Secondary | ICD-10-CM | POA: Insufficient documentation

## 2013-11-30 DIAGNOSIS — N201 Calculus of ureter: Principal | ICD-10-CM | POA: Diagnosis present

## 2013-11-30 DIAGNOSIS — Z79899 Other long term (current) drug therapy: Secondary | ICD-10-CM | POA: Insufficient documentation

## 2013-11-30 DIAGNOSIS — N23 Unspecified renal colic: Secondary | ICD-10-CM | POA: Diagnosis present

## 2013-11-30 DIAGNOSIS — Z8551 Personal history of malignant neoplasm of bladder: Secondary | ICD-10-CM | POA: Insufficient documentation

## 2013-11-30 DIAGNOSIS — R109 Unspecified abdominal pain: Secondary | ICD-10-CM

## 2013-11-30 DIAGNOSIS — R0602 Shortness of breath: Secondary | ICD-10-CM | POA: Insufficient documentation

## 2013-11-30 DIAGNOSIS — Z8249 Family history of ischemic heart disease and other diseases of the circulatory system: Secondary | ICD-10-CM

## 2013-11-30 DIAGNOSIS — Z8719 Personal history of other diseases of the digestive system: Secondary | ICD-10-CM | POA: Insufficient documentation

## 2013-11-30 DIAGNOSIS — D72829 Elevated white blood cell count, unspecified: Secondary | ICD-10-CM | POA: Diagnosis present

## 2013-11-30 DIAGNOSIS — IMO0002 Reserved for concepts with insufficient information to code with codable children: Secondary | ICD-10-CM | POA: Insufficient documentation

## 2013-11-30 DIAGNOSIS — N200 Calculus of kidney: Secondary | ICD-10-CM | POA: Diagnosis present

## 2013-11-30 DIAGNOSIS — N1330 Unspecified hydronephrosis: Secondary | ICD-10-CM | POA: Diagnosis present

## 2013-11-30 DIAGNOSIS — Z8739 Personal history of other diseases of the musculoskeletal system and connective tissue: Secondary | ICD-10-CM | POA: Insufficient documentation

## 2013-11-30 DIAGNOSIS — Z792 Long term (current) use of antibiotics: Secondary | ICD-10-CM | POA: Insufficient documentation

## 2013-11-30 DIAGNOSIS — R110 Nausea: Secondary | ICD-10-CM | POA: Insufficient documentation

## 2013-11-30 DIAGNOSIS — Z9089 Acquired absence of other organs: Secondary | ICD-10-CM | POA: Insufficient documentation

## 2013-11-30 DIAGNOSIS — Z9049 Acquired absence of other specified parts of digestive tract: Secondary | ICD-10-CM

## 2013-11-30 LAB — URINALYSIS, ROUTINE W REFLEX MICROSCOPIC
Bilirubin Urine: NEGATIVE
GLUCOSE, UA: NEGATIVE mg/dL
Ketones, ur: NEGATIVE mg/dL
Nitrite: NEGATIVE
PH: 7.5 (ref 5.0–8.0)
Protein, ur: NEGATIVE mg/dL
Specific Gravity, Urine: 1.016 (ref 1.005–1.030)
Urobilinogen, UA: 0.2 mg/dL (ref 0.0–1.0)

## 2013-11-30 LAB — CBC WITH DIFFERENTIAL/PLATELET
BASOS ABS: 0 10*3/uL (ref 0.0–0.1)
BASOS PCT: 0 % (ref 0–1)
EOS ABS: 0.1 10*3/uL (ref 0.0–0.7)
Eosinophils Relative: 1 % (ref 0–5)
HCT: 42.8 % (ref 36.0–46.0)
HEMOGLOBIN: 14.8 g/dL (ref 12.0–15.0)
Lymphocytes Relative: 12 % (ref 12–46)
Lymphs Abs: 1.1 10*3/uL (ref 0.7–4.0)
MCH: 31.7 pg (ref 26.0–34.0)
MCHC: 34.6 g/dL (ref 30.0–36.0)
MCV: 91.6 fL (ref 78.0–100.0)
MONO ABS: 0.6 10*3/uL (ref 0.1–1.0)
MONOS PCT: 7 % (ref 3–12)
NEUTROS ABS: 7.4 10*3/uL (ref 1.7–7.7)
NEUTROS PCT: 80 % — AB (ref 43–77)
Platelets: 168 10*3/uL (ref 150–400)
RBC: 4.67 MIL/uL (ref 3.87–5.11)
RDW: 12.6 % (ref 11.5–15.5)
WBC: 9.3 10*3/uL (ref 4.0–10.5)

## 2013-11-30 LAB — COMPREHENSIVE METABOLIC PANEL
ALBUMIN: 4 g/dL (ref 3.5–5.2)
ALT: 14 U/L (ref 0–35)
AST: 16 U/L (ref 0–37)
Alkaline Phosphatase: 62 U/L (ref 39–117)
BILIRUBIN TOTAL: 0.5 mg/dL (ref 0.3–1.2)
BUN: 20 mg/dL (ref 6–23)
CHLORIDE: 104 meq/L (ref 96–112)
CO2: 22 mEq/L (ref 19–32)
CREATININE: 0.65 mg/dL (ref 0.50–1.10)
Calcium: 10 mg/dL (ref 8.4–10.5)
GFR calc Af Amer: >90 mL/min (ref >90)
GFR calc non Af Amer: >90 mL/min (ref >90)
Glucose, Bld: 110 mg/dL — ABNORMAL HIGH (ref 70–99)
POTASSIUM: 4.2 meq/L (ref 3.7–5.3)
Sodium: 141 mEq/L (ref 137–147)
TOTAL PROTEIN: 7.2 g/dL (ref 6.0–8.3)

## 2013-11-30 LAB — URINE MICROSCOPIC-ADD ON

## 2013-11-30 MED ORDER — KETOROLAC TROMETHAMINE 10 MG PO TABS: 10.0000 mg | ORAL_TABLET | Freq: Four times a day (QID) | ORAL | Status: DC | PRN

## 2013-11-30 MED ORDER — HYDROMORPHONE HCL PF 1 MG/ML IJ SOLN
1.0000 mg | Freq: Once | INTRAMUSCULAR | Status: AC
  Administered 2013-11-30: 1 mg via INTRAVENOUS
  Filled 2013-11-30: qty 1

## 2013-11-30 MED ORDER — ONDANSETRON HCL 4 MG/2ML IJ SOLN
4.0000 mg | Freq: Once | INTRAMUSCULAR | Status: AC
  Administered 2013-11-30: 4 mg via INTRAVENOUS
  Filled 2013-11-30: qty 2

## 2013-11-30 MED ORDER — KETOROLAC TROMETHAMINE 30 MG/ML IJ SOLN
30.0000 mg | Freq: Once | INTRAMUSCULAR | Status: AC
  Administered 2013-11-30: 30 mg via INTRAVENOUS
  Filled 2013-11-30: qty 1

## 2013-11-30 MED ORDER — ONDANSETRON 8 MG PO TBDP: ORAL_TABLET | ORAL | Status: DC

## 2013-11-30 NOTE — ED Notes (Signed)
Pt a+ox4, presents with c/o 10/10 L flank/LLQ abd pain.  Pt reports just d/c'd from ER with dx kidney stone "and the pain meds aren't working".  Pt denies n/v, denies other complaints.  Appears uncomfortable.  Skin pwd.  Speaking full/clear sentences.  MAEI.

## 2013-11-30 NOTE — ED Notes (Signed)
Pt d/c from this facility in last 2 hours for same. Pt states pain unbearable.

## 2013-11-30 NOTE — ED Provider Notes (Signed)
CSN: [id number]     Arrival date & time 11/30/13  1427 History   First MD Initiated Contact with Patient 11/30/13 1504     Chief Complaint  Patient presents with  . Abdominal Pain  . Back Pain   (Consider location/radiation/quality/duration/timing/severity/associated sxs/prior Treatment) The history is provided by the patient and medical records. No language interpreter was used.    [name] is a 63 y.o. female  with a hx of bladder cancer (summer 2014), sigmoidectomy 2/2 diverticulitis about 10 years ago presents to the Emergency Department complaining of gradual, persistent, progressively worsening LLQ pain with associated L flank pain onset yesterday evening.  Pt report her pain worsened acutely right after eating breakfast and became so bad that she felt SOB.  Pt reports she feels full and that is she ate she might vomit, though she has not vomited.  Pt has the exact same pain last week and had a CT scan on 11/18/13 and was found to be "normal" per the patient.  Those symptoms lasted 3 days and then resolved.  Pain returned this yesterday evening.  Nothing makes her pain better or worse. Pt denies fever, chills, headache, chest pain, SOB, vomiting, diarrhea, weakness, dizziness, syncope, dysuria, gross hematuria.   Pt does report that her lower abd feels more bloated than normal.    PCP: [facility] - in [place], but has not seen that physician in 2 years    Past Medical History  Diagnosis Date  . Elevated cholesterol   . Bladder tumor     HEMATURIA - UROLOGICAL WORK UP - FOUND TO HAVE BLADDER TUMORS AND KIDNEY STONE  . Headache(784.0)     MIGRAINES  . Arthritis     LUMBAR SPINE / BACK PAIN  . Cancer     bladder cancer   Past Surgical History  Procedure Laterality Date  . C-sections      X 1  . Orif left elbow    . 2nd left elbow surgery to remove bone chips    . Left shoulder surgery to repair torn ligament    . Orif left foot    . Colon surgery       SIGMOID COLECTOMY  FOR DIVERTICULITS  . Blepharoplasty - bilateral    . Dilation and curettage of uterus    . Transurethral resection of bladder tumor with gyrus (turbt-gyrus) N/A 06/19/2013    Procedure: TRANSURETHRAL RESECTION OF BLADDER TUMOR WITH GYRUS (TURBT-GYRUS);  Surgeon: Molli Hazard, MD;  Location: [facility];  Service: Urology;  Laterality: N/A;  . Cystoscopy w/ retrogrades Bilateral 06/19/2013    Procedure: CYSTOSCOPY WITH RETROGRADE PYELOGRAM;  Surgeon: Molli Hazard, MD;  Location: [facility];  Service: Urology;  Laterality: Bilateral;   No family history on file. History  Substance Use Topics  . Smoking status: Never Smoker   . Smokeless tobacco: Never Used  . Alcohol Use: No   OB History   Grav Para Term Preterm Abortions TAB SAB Ect Mult Living                 Review of Systems  Constitutional: Negative for fever, diaphoresis, appetite change, fatigue and unexpected weight change.  HENT: Negative for mouth sores.   Eyes: Negative for visual disturbance.  Respiratory: Negative for cough, chest tightness, shortness of breath and wheezing.   Cardiovascular: Negative for chest pain.  Gastrointestinal: Positive for nausea, abdominal pain and abdominal distention (lower abd bloating). Negative for vomiting, diarrhea and constipation.  Endocrine:  Negative for polydipsia, polyphagia and polyuria.  Genitourinary: Positive for flank pain. Negative for dysuria, urgency, frequency and hematuria.  Musculoskeletal: Negative for back pain and neck stiffness.  Skin: Negative for rash.  Allergic/Immunologic: Negative for immunocompromised state.  Neurological: Negative for syncope, light-headedness and headaches.  Hematological: Does not bruise/bleed easily.  Psychiatric/Behavioral: Negative for sleep disturbance. The patient is not nervous/anxious.     Allergies  Hydrocodone-acetaminophen; Moxifloxacin; Pseudoephedrine; and Sulfonamide derivatives  Home Medications    Current Outpatient Rx  Name  Route  Sig  Dispense  Refill  . atorvastatin (LIPITOR) 10 MG tablet   Oral   Take 10 mg by mouth every other day.         Marland Kitchen azithromycin (ZITHROMAX) 250 MG tablet   Oral   Take 250-500 mg by mouth daily. As instructed         . beta carotene w/minerals (OCUVITE) tablet   Oral   Take 1 tablet by mouth daily.         Pershing Cox 200 UNIT/ACT nasal spray   Alternating Nares   Place 1 spray into alternate nostrils daily.          Marland Kitchen tobramycin-dexamethasone (TOBRADEX) ophthalmic solution   Both Eyes   Place 1 drop into both eyes every 6 (six) hours. For 7 days         . zolpidem (AMBIEN) 10 MG tablet   Oral   Take 10 mg by mouth at bedtime.          Marland Kitchen ketorolac (TORADOL) 10 MG tablet   Oral   Take 1 tablet (10 mg total) by mouth every 6 (six) hours as needed.   20 tablet   0   . ondansetron (ZOFRAN ODT) 8 MG disintegrating tablet      8mg  ODT q4 hours prn nausea   10 tablet   0    BP 121/72  Pulse 59  Temp(Src) 98.5 F (36.9 C) (Oral)  Resp 18  SpO2 92% Physical Exam  Nursing note and vitals reviewed. Constitutional: She is oriented to person, place, and time. She appears well-developed and well-nourished.  HENT:  Head: Normocephalic and atraumatic.  Mouth/Throat: Oropharynx is clear and moist.  Eyes: Conjunctivae are normal. No scleral icterus.  Cardiovascular: Normal rate, regular rhythm and intact distal pulses.   Pulmonary/Chest: Effort normal and breath sounds normal.  Abdominal: Soft. Normal appearance and bowel sounds are normal. She exhibits no distension and no mass. There is no tenderness. There is CVA tenderness (Left). There is no rigidity, no rebound and no guarding.  Abd soft and nontender though she reports generalized soreness with palpation Left CVA tenderness Abd does not appear significantly bloated or distended  Musculoskeletal: Normal range of motion. She exhibits no edema.  Neurological: She is  alert and oriented to person, place, and time. She exhibits normal muscle tone. Coordination normal.  Skin: Skin is warm and dry. No rash noted. No erythema.  Psychiatric: She has a normal mood and affect.    ED Course  Procedures (including critical care time) Labs Review Labs Reviewed  CBC WITH DIFFERENTIAL - Abnormal; Notable for the following:    Neutrophils Relative % 80 (*)    All other components within normal limits  COMPREHENSIVE METABOLIC PANEL - Abnormal; Notable for the following:    Glucose, Bld 110 (*)    All other components within normal limits  URINALYSIS, ROUTINE W REFLEX MICROSCOPIC - Abnormal; Notable for the following:    APPearance CLOUDY (*)  Hgb urine dipstick SMALL (*)    Leukocytes, UA TRACE (*)    All other components within normal limits  URINE MICROSCOPIC-ADD ON   Imaging Review US Renal  11/30/2013   CLINICAL DATA:  Flank pain  EXAM: RENAL/URINARY TRACT ULTRASOUND COMPLETE  COMPARISON:  CT ABD/PELVIS W CM dated 11/18/2013  FINDINGS: Right Kidney:  Length: 10.2 cm. There is a 4 mm echogenic focus in the upper pole of the right kidney most consistent with nephrolithiasis. There is an 8 mm anechoic right lower pole renal mass most consistent with a cyst. Echogenicity within normal limits. No hydronephrosis visualized.  Left Kidney:  Length: 10.7 cm. There is a 4 mm echogenic focus in the midpole of the left kidney likely representing nephrolithiasis. Echogenicity within normal limits. No hydronephrosis visualized.  Bladder:  Appears normal for degree of bladder distention.  IMPRESSION: 1. Bilateral nonobstructing nephrolithiasis. 2. No obstructive uropathy.   Electronically Signed   By: Kathreen Devoid   On: 11/30/2013 18:54    EKG Interpretation   None       MDM   1. Nephrolithiasis   2. Renal colic      [name] presents with abd pain present last week which resolved and returned last week.  Patient with CT scan on 11/18/2013  which showed small,  nonobstructing bilateral renal stones without ureteral stones or hydronephrosis. At that time it was also noted that she had several renal cysts, spleen and liver cysts.  There is no indication of diverticulitis at that time.  I personally reviewed the imaging tests through PACS system  I reviewed available ER/hospitalization records through the EMR  Patient reports her pain today is different from her previous episode of diverticulitis.  On exam her abdomen is soft and nontender but she has left CVA tenderness. UA without evidence of urinary tract infection but a small amount of hemoglobin. CBC without leukocytosis and CMP without alterations in her kidney or liver function.  Patient is afebrile, non-tachycardic and well appearing.  Will provide pain control and obtain renal ultrasound to assess for hydronephrosis.  7:15 PM Patient pain control 100% with Toradol and Zofran. She feels like she is back to normal.  Her abdomen remained soft and nontender at this time.  She remained afebrile and not tachycardic. No evidence of diverticulitis, cholecystitis, appendicitis or pyelonephritis.  Renal ultrasound shows bilateral nonobstructing nephrolithiasis.  Patient symptoms likely secondary to renal colic. There is no evidence of significant hydronephrosis, serum creatine WNL, vitals sign stable and the pt does not have irratractable vomiting. Pt will be dc home with pain medications & has been advised to follow up with PCP.   It has been determined that no acute conditions requiring further emergency intervention are present at this time. The patient/guardian have been advised of the diagnosis and plan. We have discussed signs and symptoms that warrant return to the ED, such as changes or worsening in symptoms.   Vital signs are stable at discharge.   BP 121/72  Pulse 59  Temp(Src) 98.5 F (36.9 C) (Oral)  Resp 18  SpO2 92%  Patient/guardian has voiced understanding and agreed to follow-up with  the PCP or specialist.      Abigail Butts, PA-C 11/30/13 [phone]

## 2013-11-30 NOTE — ED Provider Notes (Signed)
Medical screening examination/treatment/procedure(s) were performed by non-physician practitioner and as supervising physician I was immediately available for consultation/collaboration.  EKG Interpretation   None         Hoy Morn, MD 11/30/13 [phone]

## 2013-11-30 NOTE — ED Provider Notes (Addendum)
CSN: [id number]     Arrival date & time 11/30/13  2232 History   First MD Initiated Contact with Patient 11/30/13 2300     Chief Complaint  Patient presents with  . Flank Pain   (Consider location/radiation/quality/duration/timing/severity/associated sxs/prior Treatment) HPI Comments: 63 y.o. female  with a hx of bladder cancer (summer 2014), sigmoidectomy 2/2 diverticulitis about 10 years ago presents to the Emergency Department with LLQ pain. Pt was seen earlier today, and discharged with dx of nephrolithiasis - she had an [place] and basic labs - all normal. She reports pain started today, and comes in waves ,and gets pretty severe. Pain is mostly left sided. She has no uti like sx, vaginal d/c or bleeding. She has hx of bladder CA post resection. CT abd on 11/13/13 was unremarkable.   Patient is a 63 y.o. female presenting with flank pain. The history is provided by the patient.  Flank Pain Associated symptoms include abdominal pain. Pertinent negatives include no chest pain and no shortness of breath.    Past Medical History  Diagnosis Date  . Elevated cholesterol   . Bladder tumor     HEMATURIA - UROLOGICAL WORK UP - FOUND TO HAVE BLADDER TUMORS AND KIDNEY STONE  . Headache(784.0)     MIGRAINES  . Arthritis     LUMBAR SPINE / BACK PAIN  . Cancer     bladder cancer   Past Surgical History  Procedure Laterality Date  . C-sections      X 1  . Orif left elbow    . 2nd left elbow surgery to remove bone chips    . Left shoulder surgery to repair torn ligament    . Orif left foot    . Colon surgery      SIGMOID COLECTOMY  FOR DIVERTICULITS  . Blepharoplasty - bilateral    . Dilation and curettage of uterus    . Transurethral resection of bladder tumor with gyrus (turbt-gyrus) N/A 06/19/2013    Procedure: TRANSURETHRAL RESECTION OF BLADDER TUMOR WITH GYRUS (TURBT-GYRUS);  Surgeon: Molli Hazard, MD;  Location: [facility];  Service: Urology;  Laterality: N/A;  . Cystoscopy w/  retrogrades Bilateral 06/19/2013    Procedure: CYSTOSCOPY WITH RETROGRADE PYELOGRAM;  Surgeon: Molli Hazard, MD;  Location: [facility];  Service: Urology;  Laterality: Bilateral;   No family history on file. History  Substance Use Topics  . Smoking status: Never Smoker   . Smokeless tobacco: Never Used  . Alcohol Use: No   OB History   Grav Para Term Preterm Abortions TAB SAB Ect Mult Living                 Review of Systems  Constitutional: Negative for activity change.  HENT: Negative for facial swelling.   Respiratory: Negative for cough, shortness of breath and wheezing.   Cardiovascular: Negative for chest pain.  Gastrointestinal: Positive for nausea and abdominal pain. Negative for vomiting, diarrhea, constipation, blood in stool and abdominal distention.  Genitourinary: Positive for flank pain. Negative for hematuria, vaginal bleeding, vaginal discharge, difficulty urinating and vaginal pain.  Musculoskeletal: Negative for neck pain.  Skin: Negative for color change.  Neurological: Negative for speech difficulty.  Hematological: Does not bruise/bleed easily.  Psychiatric/Behavioral: Negative for confusion.    Allergies  Hydrocodone-acetaminophen; Moxifloxacin; Pseudoephedrine; and Sulfonamide derivatives  Home Medications   Current Outpatient Rx  Name  Route  Sig  Dispense  Refill  . atorvastatin (LIPITOR) 10 MG tablet   Oral  Take 10 mg by mouth every other day.         Marland Kitchen azithromycin (ZITHROMAX) 250 MG tablet   Oral   Take 250-500 mg by mouth daily. As instructed         . beta carotene w/minerals (OCUVITE) tablet   Oral   Take 1 tablet by mouth daily.         Pershing Cox 200 UNIT/ACT nasal spray   Alternating Nares   Place 1 spray into alternate nostrils daily.          Marland Kitchen ketorolac (TORADOL) 10 MG tablet   Oral   Take 1 tablet (10 mg total) by mouth every 6 (six) hours as needed.   20 tablet   0   . ondansetron (ZOFRAN ODT) 8 MG  disintegrating tablet      8mg  ODT q4 hours prn nausea   10 tablet   0   . tobramycin-dexamethasone (TOBRADEX) ophthalmic solution   Both Eyes   Place 1 drop into both eyes every 6 (six) hours. For 7 days         . zolpidem (AMBIEN) 10 MG tablet   Oral   Take 10 mg by mouth at bedtime.           BP 141/85  Pulse 63  Temp(Src) 98.4 F (36.9 C) (Oral)  Resp 16  SpO2 96% Physical Exam  Nursing note and vitals reviewed. Constitutional: She is oriented to person, place, and time. She appears well-developed.  HENT:  Head: Normocephalic and atraumatic.  Eyes: Conjunctivae and EOM are normal. Pupils are equal, round, and reactive to light.  Neck: Normal range of motion. Neck supple.  Cardiovascular: Normal rate, regular rhythm and normal heart sounds.   Pulmonary/Chest: Effort normal and breath sounds normal. No respiratory distress.  Abdominal: Soft. Bowel sounds are normal. She exhibits no distension. There is tenderness. There is no rebound and no guarding.  Left flank tenderness  Neurological: She is alert and oriented to person, place, and time.  Skin: Skin is warm and dry.    ED Course  Procedures (including critical care time) Labs Review Labs Reviewed - No data to display Imaging Review US Renal  11/30/2013   CLINICAL DATA:  Flank pain  EXAM: RENAL/URINARY TRACT ULTRASOUND COMPLETE  COMPARISON:  CT ABD/PELVIS W CM dated 11/18/2013  FINDINGS: Right Kidney:  Length: 10.2 cm. There is a 4 mm echogenic focus in the upper pole of the right kidney most consistent with nephrolithiasis. There is an 8 mm anechoic right lower pole renal mass most consistent with a cyst. Echogenicity within normal limits. No hydronephrosis visualized.  Left Kidney:  Length: 10.7 cm. There is a 4 mm echogenic focus in the midpole of the left kidney likely representing nephrolithiasis. Echogenicity within normal limits. No hydronephrosis visualized.  Bladder:  Appears normal for degree of bladder  distention.  IMPRESSION: 1. Bilateral nonobstructing nephrolithiasis. 2. No obstructive uropathy.   Electronically Signed   By: Kathreen Devoid   On: 11/30/2013 18:54    EKG Interpretation   None       MDM  No diagnosis found.  Pt comes in with cc of abd pain. Left sided abd pain - around the flank region. CT abd on 1/15, for similar sx, showed non obstructive renal stone. [place] today, showed renal stones, no hydronephrosis. Pain returned after d/c, no response to Toradol at home.  I am not sure what the etiology at this time. I ordered a KUB, no  stone seen - otherwise, i thought may be the stone is moving.  Although some components of hx are suggestive of renal colic, i am not convinced the pain is from colic. We will keep her in the ED, do serial exam, and consider CT scan. She is not a vasculopath, and vascular/ischemic etiologies considered less unlikely. Pain control for now.   Varney Biles, MD 12/01/13 0045  1:24 AM Continues to have intermittent pain that is severe. All labs, imaging reviewed - again appears colicky, nephrolithiasis type pain - but imaging is not suggestive of that. Vascular pathology (renal infarct, splenic infarct etc) also possible - but her CT was done for same pain and was neg. Will admit as obs for abd pain.  Varney Biles, MD 12/01/13 [phone]

## 2013-11-30 NOTE — Discharge Instructions (Signed)
1. Medications: Toradol, zofran, usual home medications 2. Treatment: rest, drink plenty of fluids, take medications as prescribed 3. Follow Up: Please followup with your urologist for further evaluation; return to the emergency department if you develop intractable vomiting, high fevers, worse in symptoms or or if her symptoms become concerning in any way.   Kidney Stones Kidney stones (urolithiasis) are deposits that form inside your kidneys. The intense pain is caused by the stone moving through the urinary tract. When the stone moves, the ureter goes into spasm around the stone. The stone is usually passed in the urine.  CAUSES   A disorder that makes certain neck glands produce too much parathyroid hormone (primary hyperparathyroidism).  A buildup of uric acid crystals, similar to gout in your joints.  Narrowing (stricture) of the ureter.  A kidney obstruction present at birth (congenital obstruction).  Previous surgery on the kidney or ureters.  Numerous kidney infections. SYMPTOMS   Feeling sick to your stomach (nauseous).  Throwing up (vomiting).  Blood in the urine (hematuria).  Pain that usually spreads (radiates) to the groin.  Frequency or urgency of urination. DIAGNOSIS   Taking a history and physical exam.  Blood or urine tests.  CT scan.  Occasionally, an examination of the inside of the urinary bladder (cystoscopy) is performed. TREATMENT   Observation.  Increasing your fluid intake.  Extracorporeal shock wave lithotripsy This is a noninvasive procedure that uses shock waves to break up kidney stones.  Surgery may be needed if you have severe pain or persistent obstruction. There are various surgical procedures. Most of the procedures are performed with the use of small instruments. Only small incisions are needed to accommodate these instruments, so recovery time is minimized. The size, location, and chemical composition are all important variables  that will determine the proper choice of action for you. Talk to your health care provider to better understand your situation so that you will minimize the risk of injury to yourself and your kidney.  HOME CARE INSTRUCTIONS   Drink enough water and fluids to keep your urine clear or pale yellow. This will help you to pass the stone or stone fragments.  Strain all urine through the provided strainer. Keep all particulate matter and stones for your health care provider to see. The stone causing the pain may be as small as a grain of salt. It is very important to use the strainer each and every time you pass your urine. The collection of your stone will allow your health care provider to analyze it and verify that a stone has actually passed. The stone analysis will often identify what you can do to reduce the incidence of recurrences.  Only take over-the-counter or prescription medicines for pain, discomfort, or fever as directed by your health care provider.  Make a follow-up appointment with your health care provider as directed.  Get follow-up X-rays if required. The absence of pain does not always mean that the stone has passed. It may have only stopped moving. If the urine remains completely obstructed, it can cause loss of kidney function or even complete destruction of the kidney. It is your responsibility to make sure X-rays and follow-ups are completed. Ultrasounds of the kidney can show blockages and the status of the kidney. Ultrasounds are not associated with any radiation and can be performed easily in a matter of minutes. SEEK MEDICAL CARE IF:  You experience pain that is progressive and unresponsive to any pain medicine you have been prescribed.  SEEK IMMEDIATE MEDICAL CARE IF:   Pain cannot be controlled with the prescribed medicine.  You have a fever or shaking chills.  The severity or intensity of pain increases over 18 hours and is not relieved by pain medicine.  You develop a  new onset of abdominal pain.  You feel faint or pass out.  You are unable to urinate. MAKE SURE YOU:   Understand these instructions.  Will watch your condition.  Will get help right away if you are not doing well or get worse. Document Released: 10/16/2005 Document Revised: 06/18/2013 Document Reviewed: 03/19/2013 [facility] Patient Information 2014 [place].

## 2013-11-30 NOTE — ED Notes (Signed)
Pt from home c/o sudden onset LLQ pain and L flank pain. Pt was tx for bladder CA this summer. Pt had similar sx last week with PCP for the same and was told to be seen at ED if worsened. Pt is A&O and in NAD

## 2013-12-01 ENCOUNTER — Encounter (HOSPITAL_COMMUNITY): Payer: Self-pay | Admitting: Internal Medicine

## 2013-12-01 ENCOUNTER — Inpatient Hospital Stay (HOSPITAL_COMMUNITY): Payer: BC Managed Care – PPO

## 2013-12-01 DIAGNOSIS — E785 Hyperlipidemia, unspecified: Secondary | ICD-10-CM

## 2013-12-01 DIAGNOSIS — R109 Unspecified abdominal pain: Secondary | ICD-10-CM

## 2013-12-01 DIAGNOSIS — Z8551 Personal history of malignant neoplasm of bladder: Secondary | ICD-10-CM

## 2013-12-01 LAB — COMPREHENSIVE METABOLIC PANEL
ALT: 13 U/L (ref 0–35)
AST: 21 U/L (ref 0–37)
Albumin: 3.9 g/dL (ref 3.5–5.2)
Alkaline Phosphatase: 56 U/L (ref 39–117)
BILIRUBIN TOTAL: 0.4 mg/dL (ref 0.3–1.2)
BUN: 28 mg/dL — ABNORMAL HIGH (ref 6–23)
CALCIUM: 9.5 mg/dL (ref 8.4–10.5)
CHLORIDE: 102 meq/L (ref 96–112)
CO2: 23 meq/L (ref 19–32)
Creatinine, Ser: 0.93 mg/dL (ref 0.50–1.10)
GFR, EST AFRICAN AMERICAN: 74 mL/min — AB (ref >90)
GFR, EST NON AFRICAN AMERICAN: 64 mL/min — AB (ref >90)
Glucose, Bld: 185 mg/dL — ABNORMAL HIGH (ref 70–99)
Potassium: 4.4 mEq/L (ref 3.7–5.3)
Sodium: 141 mEq/L (ref 137–147)
Total Protein: 6.8 g/dL (ref 6.0–8.3)

## 2013-12-01 LAB — CBC WITH DIFFERENTIAL/PLATELET
Basophils Absolute: 0 10*3/uL (ref 0.0–0.1)
Basophils Relative: 0 % (ref 0–1)
EOS PCT: 0 % (ref 0–5)
Eosinophils Absolute: 0 10*3/uL (ref 0.0–0.7)
HEMATOCRIT: 41.5 % (ref 36.0–46.0)
Hemoglobin: 14.2 g/dL (ref 12.0–15.0)
LYMPHS PCT: 7 % — AB (ref 12–46)
Lymphs Abs: 0.9 10*3/uL (ref 0.7–4.0)
MCH: 31.5 pg (ref 26.0–34.0)
MCHC: 34.2 g/dL (ref 30.0–36.0)
MCV: 92 fL (ref 78.0–100.0)
Monocytes Absolute: 0.9 10*3/uL (ref 0.1–1.0)
Monocytes Relative: 7 % (ref 3–12)
NEUTROS ABS: 11.9 10*3/uL — AB (ref 1.7–7.7)
Neutrophils Relative %: 86 % — ABNORMAL HIGH (ref 43–77)
Platelets: 217 10*3/uL (ref 150–400)
RBC: 4.51 MIL/uL (ref 3.87–5.11)
RDW: 12.7 % (ref 11.5–15.5)
WBC: 13.8 10*3/uL — AB (ref 4.0–10.5)

## 2013-12-01 LAB — URINALYSIS, ROUTINE W REFLEX MICROSCOPIC
BILIRUBIN URINE: NEGATIVE
Glucose, UA: NEGATIVE mg/dL
KETONES UR: 15 mg/dL — AB
NITRITE: NEGATIVE
PH: 5.5 (ref 5.0–8.0)
PROTEIN: 30 mg/dL — AB
Specific Gravity, Urine: 1.023 (ref 1.005–1.030)
Urobilinogen, UA: 0.2 mg/dL (ref 0.0–1.0)

## 2013-12-01 LAB — TROPONIN I: Troponin I: <0.3 ng/mL (ref <0.30)

## 2013-12-01 LAB — CK: CK TOTAL: 64 U/L (ref 7–177)

## 2013-12-01 LAB — URINE MICROSCOPIC-ADD ON

## 2013-12-01 LAB — CG4 I-STAT (LACTIC ACID): LACTIC ACID, VENOUS: 1.33 mmol/L (ref 0.5–2.2)

## 2013-12-01 MED ORDER — ACETAMINOPHEN 650 MG RE SUPP: 650.0000 mg | Freq: Four times a day (QID) | RECTAL | Status: DC | PRN

## 2013-12-01 MED ORDER — DEXTROSE 5 % IV SOLN
1.0000 g | INTRAVENOUS | Status: DC
  Administered 2013-12-01: 1 g via INTRAVENOUS
  Filled 2013-12-01 (×2): qty 10

## 2013-12-01 MED ORDER — TAMSULOSIN HCL 0.4 MG PO CAPS
0.4000 mg | ORAL_CAPSULE | Freq: Every day | ORAL | Status: DC
  Administered 2013-12-01: 0.4 mg via ORAL
  Filled 2013-12-01 (×2): qty 1

## 2013-12-01 MED ORDER — PROMETHAZINE HCL 25 MG/ML IJ SOLN
12.5000 mg | Freq: Once | INTRAMUSCULAR | Status: AC
  Administered 2013-12-01: 12.5 mg via INTRAVENOUS
  Filled 2013-12-01: qty 1

## 2013-12-01 MED ORDER — ONDANSETRON HCL 4 MG/2ML IJ SOLN
4.0000 mg | Freq: Four times a day (QID) | INTRAMUSCULAR | Status: DC | PRN
  Filled 2013-12-01: qty 2

## 2013-12-01 MED ORDER — KETOROLAC TROMETHAMINE 15 MG/ML IJ SOLN
15.0000 mg | Freq: Four times a day (QID) | INTRAMUSCULAR | Status: DC | PRN
  Administered 2013-12-01: 15 mg via INTRAVENOUS
  Filled 2013-12-01: qty 1

## 2013-12-01 MED ORDER — ONDANSETRON HCL 4 MG/2ML IJ SOLN
4.0000 mg | Freq: Four times a day (QID) | INTRAMUSCULAR | Status: DC | PRN
  Administered 2013-12-01 (×2): 4 mg via INTRAVENOUS
  Filled 2013-12-01 (×2): qty 2

## 2013-12-01 MED ORDER — ZOLPIDEM TARTRATE 10 MG PO TABS: 10.0000 mg | ORAL_TABLET | Freq: Every day | ORAL | Status: DC

## 2013-12-01 MED ORDER — ONDANSETRON HCL 4 MG PO TABS: 4.0000 mg | ORAL_TABLET | ORAL | Status: DC | PRN

## 2013-12-01 MED ORDER — MORPHINE SULFATE 4 MG/ML IJ SOLN: 4.0000 mg | Freq: Once | INTRAMUSCULAR | Status: DC

## 2013-12-01 MED ORDER — HYDROMORPHONE HCL PF 1 MG/ML IJ SOLN
1.0000 mg | Freq: Once | INTRAMUSCULAR | Status: AC
  Administered 2013-12-01: 1 mg via INTRAVENOUS
  Filled 2013-12-01: qty 1

## 2013-12-01 MED ORDER — ACETAMINOPHEN 325 MG PO TABS
650.0000 mg | ORAL_TABLET | Freq: Four times a day (QID) | ORAL | Status: DC | PRN
  Filled 2013-12-01: qty 2

## 2013-12-01 MED ORDER — ATORVASTATIN CALCIUM 10 MG PO TABS
10.0000 mg | ORAL_TABLET | ORAL | Status: DC
  Filled 2013-12-01: qty 1

## 2013-12-01 MED ORDER — IOHEXOL 300 MG/ML  SOLN
125.0000 mL | Freq: Once | INTRAMUSCULAR | Status: AC | PRN
  Administered 2013-12-01: 125 mL via INTRAVENOUS

## 2013-12-01 MED ORDER — TOBRAMYCIN-DEXAMETHASONE 0.3-0.1 % OP SUSP
1.0000 [drp] | Freq: Four times a day (QID) | OPHTHALMIC | Status: DC
  Administered 2013-12-01 – 2013-12-02 (×3): 1 [drp] via OPHTHALMIC
  Filled 2013-12-01: qty 2.5

## 2013-12-01 MED ORDER — SODIUM CHLORIDE 0.9 % IV SOLN
INTRAVENOUS | Status: AC
  Administered 2013-12-01 – 2013-12-02 (×3): via INTRAVENOUS

## 2013-12-01 MED ORDER — HYDROMORPHONE HCL PF 1 MG/ML IJ SOLN
1.0000 mg | INTRAMUSCULAR | Status: DC | PRN
  Administered 2013-12-01 (×2): 1 mg via INTRAVENOUS
  Filled 2013-12-01 (×3): qty 1

## 2013-12-01 MED ORDER — ONDANSETRON HCL 4 MG PO TABS: 4.0000 mg | ORAL_TABLET | Freq: Four times a day (QID) | ORAL | Status: DC | PRN

## 2013-12-01 MED ORDER — ZOLPIDEM TARTRATE 5 MG PO TABS: 5.0000 mg | ORAL_TABLET | Freq: Every day | ORAL | Status: DC

## 2013-12-01 NOTE — H&P (Signed)
[facility] History and Physical  Terease Marcotte WCB:[id number] DOB: 1950/11/15 DOA: 11/30/2013  Referring physician: ER physician. PCP: Lilian Coma, MD  Specialists: Dr. Jasmine December. Urologist.  Chief Complaint: Left flank pain.  HPI: [name] is a 63 y.o. female with history of bladder cancer, hyperlipidemia has been experiencing left flank pain since yesterday morning. Patient had come to the ER earlier and had renal sonogram which was unremarkable and labs also were unremarkable and patient was discharged home after pain relief medications returns back because of severe left flank pain. Patient has had similar pain 10 days ago which lasted for around 4-5 days. At that time CT abdomen and pelvis with contrast done was unremarkable. Patient's pain slowly resolved at the time. Patient was pain-free from 3-4 days and pain started recurring yesterday morning. Denies any fever chills or diarrhea. After patient came second time last evening to the ER patient had couple of episodes of nausea and vomiting. Denies any chest pain or shortness of breath. Denies any trauma or falls. Pain is present all the time with episodes of worsening intensity. The pain starts around epigastric area and goes to the left flank area. Patient states the pain is more severe than what it was 10 days ago.   Review of Systems: As presented in the history of presenting illness, rest negative.  Past Medical History  Diagnosis Date  . Elevated cholesterol   . Bladder tumor     HEMATURIA - UROLOGICAL WORK UP - FOUND TO HAVE BLADDER TUMORS AND KIDNEY STONE  . Headache(784.0)     MIGRAINES  . Arthritis     LUMBAR SPINE / BACK PAIN  . Cancer     bladder cancer   Past Surgical History  Procedure Laterality Date  . C-sections      X 1  . Orif left elbow    . 2nd left elbow surgery to remove bone chips    . Left shoulder surgery to repair torn ligament    . Orif left foot    . Colon surgery      SIGMOID  COLECTOMY  FOR DIVERTICULITS  . Blepharoplasty - bilateral    . Dilation and curettage of uterus    . Transurethral resection of bladder tumor with gyrus (turbt-gyrus) N/A 06/19/2013    Procedure: TRANSURETHRAL RESECTION OF BLADDER TUMOR WITH GYRUS (TURBT-GYRUS);  Surgeon: Molli Hazard, MD;  Location: [facility];  Service: Urology;  Laterality: N/A;  . Cystoscopy w/ retrogrades Bilateral 06/19/2013    Procedure: CYSTOSCOPY WITH RETROGRADE PYELOGRAM;  Surgeon: Molli Hazard, MD;  Location: [facility];  Service: Urology;  Laterality: Bilateral;   Social History:  reports that she has never smoked. She has never used smokeless tobacco. She reports that she does not drink alcohol or use illicit drugs. Where does patient live home. Can patient participate in ADLs? Yes.  Allergies  Allergen Reactions  . Hydrocodone-Acetaminophen     REACTION: nightmares  . Moxifloxacin     REACTION: rash, itching  . Pseudoephedrine     REACTION: makes heart race  . Sulfonamide Derivatives     REACTION: hives    Family History:  Family History  Problem Relation Age of Onset  . Hypertension Brother       Prior to Admission medications   Medication Sig Start Date End Date Taking? Authorizing Provider  atorvastatin (LIPITOR) 10 MG tablet Take 10 mg by mouth every other day.   Yes Historical Provider, MD  azithromycin (ZITHROMAX) 250 MG tablet  Take 250-500 mg by mouth daily. As instructed 11/26/13  Yes Historical Provider, MD  beta carotene w/minerals (OCUVITE) tablet Take 1 tablet by mouth daily.   Yes Historical Provider, MD  FORTICAL 200 UNIT/ACT nasal spray Place 1 spray into alternate nostrils daily.  11/20/13  Yes Historical Provider, MD  ketorolac (TORADOL) 10 MG tablet Take 1 tablet (10 mg total) by mouth every 6 (six) hours as needed. 11/30/13  Yes Hannah Muthersbaugh, PA-C  tobramycin-dexamethasone (TOBRADEX) ophthalmic solution Place 1 drop into both eyes every 6 (six) hours. For 7 days  11/26/13  Yes Historical Provider, MD  zolpidem (AMBIEN) 10 MG tablet Take 10 mg by mouth at bedtime.  11/24/13  Yes Historical Provider, MD  ondansetron (ZOFRAN ODT) 8 MG disintegrating tablet 8mg  ODT q4 hours prn nausea 11/30/13   Abigail Butts, PA-C    Physical Exam: Filed Vitals:   11/30/13 2238 12/01/13 0204 12/01/13 0234  BP: 141/85 137/76 126/66  Pulse: 63 63 83  Temp: 98.4 F (36.9 C)  98.4 F (36.9 C)  TempSrc: Oral  Oral  Resp: 16  16  Height:   5\' 3"  (1.6 m)  Weight:   53.8 kg (118 lb 9.7 oz)  SpO2: 96% 95% 95%     General:  Well-developed and nourished.  Eyes: Anicteric no pallor.  ENT: No discharge from the ears eyes nose or mouth.  Neck: No mass felt.  Cardiovascular: S1-S2 heard.  Respiratory: No rhonchi or crepitations.  Abdomen: Soft nontender bowel sounds present. No guarding rigidity.  Skin: No rash.  Musculoskeletal: No edema.  Psychiatric: Appears normal.  Neurologic: Alert awake oriented to time place and person. Moves all extremities.  Labs on Admission:  Basic Metabolic Panel:  Recent Labs Lab 11/30/13 1434  NA 141  K 4.2  CL 104  CO2 22  GLUCOSE 110*  BUN 20  CREATININE 0.65  CALCIUM 10.0   Liver Function Tests:  Recent Labs Lab 11/30/13 1434  AST 16  ALT 14  ALKPHOS 62  BILITOT 0.5  PROT 7.2  ALBUMIN 4.0   No results found for this basename: LIPASE, AMYLASE,  in the last 168 hours No results found for this basename: AMMONIA,  in the last 168 hours CBC:  Recent Labs Lab 11/30/13 1434 12/01/13 0113  WBC 9.3 13.8*  NEUTROABS 7.4 11.9*  HGB 14.8 14.2  HCT 42.8 41.5  MCV 91.6 92.0  PLT 168 217   Cardiac Enzymes: No results found for this basename: CKTOTAL, CKMB, CKMBINDEX, TROPONINI,  in the last 168 hours  BNP (last 3 results) No results found for this basename: PROBNP,  in the last 8760 hours CBG: No results found for this basename: GLUCAP,  in the last 168 hours  Radiological Exams on Admission: Dg  Abd 1 View  11/30/2013   CLINICAL DATA:  Left flank pain.  History of urinary tract stones.  EXAM: ABDOMEN - 1 VIEW  COMPARISON:  CT abdomen and pelvis 11/18/2013.  FINDINGS: Small nonobstructing renal stones seen on CT scan are not well demonstrated on plain film due to extensive gas and stool present in the abdomen. No evidence of ureteral stone is identified. The bowel gas pattern is nonobstructive. No focal bony abnormality is identified.  IMPRESSION: No acute finding. Renal stones seen on CT scan are not well demonstrated on plain film due to extensive gas and stool.   Electronically Signed   By: Inge Rise M.D.   On: 11/30/2013 23:55   US Renal  11/30/2013  CLINICAL DATA:  Flank pain  EXAM: RENAL/URINARY TRACT ULTRASOUND COMPLETE  COMPARISON:  CT ABD/PELVIS W CM dated 11/18/2013  FINDINGS: Right Kidney:  Length: 10.2 cm. There is a 4 mm echogenic focus in the upper pole of the right kidney most consistent with nephrolithiasis. There is an 8 mm anechoic right lower pole renal mass most consistent with a cyst. Echogenicity within normal limits. No hydronephrosis visualized.  Left Kidney:  Length: 10.7 cm. There is a 4 mm echogenic focus in the midpole of the left kidney likely representing nephrolithiasis. Echogenicity within normal limits. No hydronephrosis visualized.  Bladder:  Appears normal for degree of bladder distention.  IMPRESSION: 1. Bilateral nonobstructing nephrolithiasis. 2. No obstructive uropathy.   Electronically Signed   By: Kathreen Devoid   On: 11/30/2013 18:54     Assessment/Plan Principal Problem:   Left flank pain Active Problems:   HLD (hyperlipidemia)   History of bladder cancer   1. Left flank pain - cause not clear. At this time because patient's pain is quite severe I have ordered a repeat CT abdomen and pelvis with contrast. I have also ordered repeat labs including metabolic panel and urinalysis, lactic acid CK and troponins. I have placed patient on Dilaudid IV for  pain relief with gentle hydration. If the repeat CT abdomen and pelvis is unremarkable then may need MRI to check for spine pathology like compression fractures. 2. Leukocytosis - patient is afebrile. Closely follow CBC trends. 3. Hyperlipidemia - continue statins. 4. History of bladder cancer - patient follows Dr. Jasmine December.  I have reviewed patient's old charts and labs.  Code Status: Full code.  Family Communication: Patient's family at the bedside.  Disposition Plan: Admit to inpatient.    Koua Deeg N. [facility] Pager [phone].  If 7PM-7AM, please contact night-coverage [email or web address] Password [email or web address] 12/01/2013, 3:11 AM

## 2013-12-01 NOTE — Progress Notes (Signed)
Patient called for pain medication but when assessed patient sound asleep

## 2013-12-01 NOTE — Consult Note (Signed)
ATTENDING ADDENDUM:  I have repeated history and physical exam on this patient. I have read this as Yarbrough's note and I agree with her assessment and plan. Any exceptions or listed below.  He'll be best for this patient to pass the stone on her own. I've asked her to cut back off of using narcotic pain medications. She has a high chance of passing a 3 mm stone. She was started on Flomax. She'll stay in the hospital overnight. Showing urine.  I will reassess her in the morning. If she has severe pain after that an intervention may be indicated. If not possible she could be discharged home tomorrow.  I would like to thank hospital service for assistance in this patient.

## 2013-12-01 NOTE — Consult Note (Signed)
Urology Consult   Physician requesting consult: Dhungel  Reason for consult: Nephrolithiasis  History of Present Illness: [name] is a 63 y.o. female with PMH significant for bladder cancer, diverticulitis, and migraines who presented to the ED yesterday for eval of left flank pain.  She states this initially began 10 days ago with no other associated sx.  She presented for eval at that time and had a CT with contrast that revealed only bilateral non obstructing renal stones.  The pain improved and she was d/c'd home.  Since that time the pain has been intermittent and resolved without intervention.  Yesterday morning, however, the pain became intense and she presented to the ED for eval.  RUS again showed only non obstructing stones and her pain was controlled with Toradol.  She was d/c'd home with pain medications. After d/c she developed N/V.  She returned to the ED and was admitted for IVF and sx control.  Repeat CT showed 77mm left UVJ stone with mild left hydro.  Her pain and N/V have improved with medications, however, now she feels as though the pain meds may be contributing to her nausea.  She is receiving IVFs and the staff has been straining her urine.  At no time has she noted F/C, dysuria, hematuria, or difficulty voiding.    She is currently resting comfortably and her only complaint is of dry mouth as she has been NPO today.    Her last eval with Dr. Jasmine December was 10/10/2013 at which time she had a negative cystoscopy as part of her continued f/u for low grade bladder cancer. Her previous imaging had revealed bilateral renal lesions to small to characterize and bilateral non obstructing renal stones. She has a scheduled f/u appt with him in March.    Past Medical History  Diagnosis Date  . Elevated cholesterol   . Bladder tumor     HEMATURIA - UROLOGICAL WORK UP - FOUND TO HAVE BLADDER TUMORS AND KIDNEY STONE  . Headache(784.0)     MIGRAINES  . Arthritis     LUMBAR SPINE / BACK  PAIN  . Cancer     bladder cancer    Past Surgical History  Procedure Laterality Date  . C-sections      X 1  . Orif left elbow    . 2nd left elbow surgery to remove bone chips    . Left shoulder surgery to repair torn ligament    . Orif left foot    . Colon surgery      SIGMOID COLECTOMY  FOR DIVERTICULITS  . Blepharoplasty - bilateral    . Dilation and curettage of uterus    . Transurethral resection of bladder tumor with gyrus (turbt-gyrus) N/A 06/19/2013    Procedure: TRANSURETHRAL RESECTION OF BLADDER TUMOR WITH GYRUS (TURBT-GYRUS);  Surgeon: Molli Hazard, MD;  Location: [facility];  Service: Urology;  Laterality: N/A;  . Cystoscopy w/ retrogrades Bilateral 06/19/2013    Procedure: CYSTOSCOPY WITH RETROGRADE PYELOGRAM;  Surgeon: Molli Hazard, MD;  Location: [facility];  Service: Urology;  Laterality: Bilateral;     Current Hospital Medications:  Home meds:    Medication List    ASK your doctor about these medications       atorvastatin 10 MG tablet  Commonly known as:  LIPITOR  Take 10 mg by mouth every other day.     azithromycin 250 MG tablet  Commonly known as:  ZITHROMAX  Take 250-500 mg by mouth daily. As instructed  beta carotene w/minerals tablet  Take 1 tablet by mouth daily.     FORTICAL 200 UNIT/ACT nasal spray  Generic drug:  calcitonin (salmon)  Place 1 spray into alternate nostrils daily.     ketorolac 10 MG tablet  Commonly known as:  TORADOL  Take 1 tablet (10 mg total) by mouth every 6 (six) hours as needed.     ondansetron 8 MG disintegrating tablet  Commonly known as:  ZOFRAN ODT  8mg  ODT q4 hours prn nausea     tobramycin-dexamethasone ophthalmic solution  Commonly known as:  TOBRADEX  Place 1 drop into both eyes every 6 (six) hours. For 7 days     zolpidem 10 MG tablet  Commonly known as:  AMBIEN  Take 10 mg by mouth at bedtime.        Scheduled Meds: . atorvastatin  10 mg Oral QODAY  . cefTRIAXone (ROCEPHIN)  IV   1 g Intravenous Q24H  . tamsulosin  0.4 mg Oral QPC supper  . tobramycin-dexamethasone  1 drop Both Eyes Q6H  . zolpidem  5 mg Oral QHS   Continuous Infusions: . sodium chloride 100 mL/hr at 12/01/13 1319   PRN Meds:.acetaminophen, acetaminophen, HYDROmorphone (DILAUDID) injection, ketorolac, ondansetron (ZOFRAN) IV, ondansetron  Allergies:  Allergies  Allergen Reactions  . Hydrocodone-Acetaminophen     REACTION: nightmares  . Moxifloxacin     REACTION: rash, itching  . Pseudoephedrine     REACTION: makes heart race  . Sulfonamide Derivatives     REACTION: hives    Family History  Problem Relation Age of Onset  . Hypertension Brother     Social History:  reports that she has never smoked. She has never used smokeless tobacco. She reports that she does not drink alcohol or use illicit drugs.  ROS: A complete review of systems was performed.  All systems are negative except for pertinent findings as noted.  Physical Exam:  Vital signs in last 24 hours: Temp:  [97.6 F (36.4 C)-99 F (37.2 C)] 99 F (37.2 C) (02/02 1454) Pulse Rate:  [55-83] 57 (02/02 1426) Resp:  [16-18] 17 (02/02 1426) BP: (103-141)/(56-85) 103/56 mmHg (02/02 1426) SpO2:  [92 %-96 %] 96 % (02/02 1426) Weight:  [53.8 kg (118 lb 9.7 oz)] 53.8 kg (118 lb 9.7 oz) (02/02 0234) Constitutional:  Alert and oriented, No acute distress Cardiovascular: Regular rate and rhythm Respiratory: Normal respiratory effort GI: Abdomen is soft, nontender, nondistended, no abdominal masses GU: No CVA tenderness Lymphatic: No lymphadenopathy Neurologic: Grossly intact, no focal deficits Psychiatric: Normal mood and affect  Laboratory Data:   Recent Labs  11/30/13 1434 12/01/13 0113  WBC 9.3 13.8*  HGB 14.8 14.2  HCT 42.8 41.5  PLT 168 217     Recent Labs  11/30/13 1434 12/01/13 0341  NA 141 141  K 4.2 4.4  CL 104 102  GLUCOSE 110* 185*  BUN 20 28*  CALCIUM 10.0 9.5  CREATININE 0.65 0.93      Results for orders placed during the hospital encounter of 11/30/13 (from the past 24 hour(s))  CBC WITH DIFFERENTIAL     Status: Abnormal   Collection Time    12/01/13  1:13 AM      Result Value Range   WBC 13.8 (*) 4.0 - 10.5 K/uL   RBC 4.51  3.87 - 5.11 MIL/uL   Hemoglobin 14.2  12.0 - 15.0 g/dL   HCT 41.5  36.0 - 46.0 %   MCV 92.0  78.0 - 100.0  fL   MCH 31.5  26.0 - 34.0 pg   MCHC 34.2  30.0 - 36.0 g/dL   RDW 12.7  11.5 - 15.5 %   Platelets 217  150 - 400 K/uL   Neutrophils Relative % 86 (*) 43 - 77 %   Neutro Abs 11.9 (*) 1.7 - 7.7 K/uL   Lymphocytes Relative 7 (*) 12 - 46 %   Lymphs Abs 0.9  0.7 - 4.0 K/uL   Monocytes Relative 7  3 - 12 %   Monocytes Absolute 0.9  0.1 - 1.0 K/uL   Eosinophils Relative 0  0 - 5 %   Eosinophils Absolute 0.0  0.0 - 0.7 K/uL   Basophils Relative 0  0 - 1 %   Basophils Absolute 0.0  0.0 - 0.1 K/uL  CG4 I-STAT (LACTIC ACID)     Status: None   Collection Time    12/01/13  1:45 AM      Result Value Range   Lactic Acid, Venous 1.33  0.5 - 2.2 mmol/L  COMPREHENSIVE METABOLIC PANEL     Status: Abnormal   Collection Time    12/01/13  3:41 AM      Result Value Range   Sodium 141  137 - 147 mEq/L   Potassium 4.4  3.7 - 5.3 mEq/L   Chloride 102  96 - 112 mEq/L   CO2 23  19 - 32 mEq/L   Glucose, Bld 185 (*) 70 - 99 mg/dL   BUN 28 (*) 6 - 23 mg/dL   Creatinine, Ser 0.93  0.50 - 1.10 mg/dL   Calcium 9.5  8.4 - 10.5 mg/dL   Total Protein 6.8  6.0 - 8.3 g/dL   Albumin 3.9  3.5 - 5.2 g/dL   AST 21  0 - 37 U/L   ALT 13  0 - 35 U/L   Alkaline Phosphatase 56  39 - 117 U/L   Total Bilirubin 0.4  0.3 - 1.2 mg/dL   GFR calc non Af Amer 64 (*) >90 mL/min   GFR calc Af Amer 74 (*) >90 mL/min  TROPONIN I     Status: None   Collection Time    12/01/13  3:41 AM      Result Value Range   Troponin I <0.30  <0.30 ng/mL  CK     Status: None   Collection Time    12/01/13  3:41 AM      Result Value Range   Total CK 64  7 - 177 U/L  URINALYSIS,  ROUTINE W REFLEX MICROSCOPIC     Status: Abnormal   Collection Time    12/01/13  7:10 AM      Result Value Range   Color, Urine AMBER (*) YELLOW   APPearance CLOUDY (*) CLEAR   Specific Gravity, Urine 1.023  1.005 - 1.030   pH 5.5  5.0 - 8.0   Glucose, UA NEGATIVE  NEGATIVE mg/dL   Hgb urine dipstick LARGE (*) NEGATIVE   Bilirubin Urine NEGATIVE  NEGATIVE   Ketones, ur 15 (*) NEGATIVE mg/dL   Protein, ur 30 (*) NEGATIVE mg/dL   Urobilinogen, UA 0.2  0.0 - 1.0 mg/dL   Nitrite NEGATIVE  NEGATIVE   Leukocytes, UA SMALL (*) NEGATIVE  URINE MICROSCOPIC-ADD ON     Status: Abnormal   Collection Time    12/01/13  7:10 AM      Result Value Range   WBC, UA 7-10  <3 WBC/hpf   RBC / HPF  TOO NUMEROUS TO COUNT  <3 RBC/hpf   Bacteria, UA FEW (*) RARE   Urine-Other MUCOUS PRESENT     No results found for this or any previous visit (from the past 240 hour(s)).  Renal Function:  Recent Labs  11/30/13 1434 12/01/13 0341  CREATININE 0.65 0.93   Estimated Creatinine Clearance: 50.6 ml/min (by C-G formula based on Cr of 0.93).  Radiologic Imaging: Dg Abd 1 View  11/30/2013   CLINICAL DATA:  Left flank pain.  History of urinary tract stones.  EXAM: ABDOMEN - 1 VIEW  COMPARISON:  CT abdomen and pelvis 11/18/2013.  FINDINGS: Small nonobstructing renal stones seen on CT scan are not well demonstrated on plain film due to extensive gas and stool present in the abdomen. No evidence of ureteral stone is identified. The bowel gas pattern is nonobstructive. No focal bony abnormality is identified.  IMPRESSION: No acute finding. Renal stones seen on CT scan are not well demonstrated on plain film due to extensive gas and stool.   Electronically Signed   By: Inge Rise M.D.   On: 11/30/2013 23:55   Ct Abdomen Pelvis W Contrast  12/01/2013   CLINICAL DATA:  History of bladder cancer. Left-sided abdominal pain.  EXAM: CT ABDOMEN AND PELVIS WITH CONTRAST  TECHNIQUE: Multidetector CT imaging of the abdomen  and pelvis was performed using the standard protocol following bolus administration of intravenous contrast.  CONTRAST:  133mL OMNIPAQUE IOHEXOL 300 MG/ML  SOLN  COMPARISON:  None.  FINDINGS: A 3 mm stone lies within the left ureterovesicular junction. This causes mild left hydronephrosis.  There are nonobstructing stones in both kidneys. There are bilateral small renal cysts. No right hydronephrosis. Normal right ureter.  The bladder is unremarkable other than the left UVJ stone. No bladder mass is seen.  Mild subsegmental atelectasis noted at the lung bases. Heart is normal in size.  Small liver cysts, stable. Liver is otherwise unremarkable. Normal spleen and gallbladder. No bile duct dilation. There is dilation of the pancreatic duct, stable. Pancreas is otherwise unremarkable. No adrenal masses.  There is a low sigmoid colon anastomosis staple line. Scattered diverticula are noted along the colon and there is mild increased stool throughout the colon. No colonic wall thickening or inflammatory changes are seen. Small bowel is unremarkable. A normal appendix is visualized.  No adenopathy.  No abnormal fluid collections.  Bony structures are unremarkable.  IMPRESSION: 1. 3 mm stone at the left ureterovesicular junction causes mild left hydroureteronephrosis. No other acute finding. 2. Small nonobstructing stones are noted in each kidney. 3. No evidence of a bladder mass. No adenopathy. No evidence of locally invasive for metastatic bladder carcinoma. 4. Liver and renal cysts. 5. Scattered colonic diverticula. No diverticulitis. Mild increased colonic stool.   Electronically Signed   By: Lajean Manes M.D.   On: 12/01/2013 10:31   US Renal  11/30/2013   CLINICAL DATA:  Flank pain  EXAM: RENAL/URINARY TRACT ULTRASOUND COMPLETE  COMPARISON:  CT ABD/PELVIS W CM dated 11/18/2013  FINDINGS: Right Kidney:  Length: 10.2 cm. There is a 4 mm echogenic focus in the upper pole of the right kidney most consistent with  nephrolithiasis. There is an 8 mm anechoic right lower pole renal mass most consistent with a cyst. Echogenicity within normal limits. No hydronephrosis visualized.  Left Kidney:  Length: 10.7 cm. There is a 4 mm echogenic focus in the midpole of the left kidney likely representing nephrolithiasis. Echogenicity within normal limits. No hydronephrosis visualized.  Bladder:  Appears normal for degree of bladder distention.  IMPRESSION: 1. Bilateral nonobstructing nephrolithiasis. 2. No obstructive uropathy.   Electronically Signed   By: Kathreen Devoid   On: 11/30/2013 18:54    Impression/Recommendation:  70mm left UVJ stone with mild hydro---recommend continued sx control and IVF. Would add flomax.  Instructed pt to continue to strain urine.  No intervention is needed at this time as the pt should be able to pass the stone on her own. Dr. Jasmine December would like repeat KUB this afternoon to eval stone position.  Will also advance her diet to clears since she is not going to require procedure and her nausea is controlled.  She should f/u with Dr. Jasmine December in 1-2 weeks as an outpt to ensure stone has passed.   She is currently receiving Rocephin for possible UTI.  Urine does not appear infected and is consistent with changes seen with stones.  F/u urine culture and d/c Abx if negative.   Marcie Bal 12/01/2013, 4:27 PM

## 2013-12-01 NOTE — Progress Notes (Signed)
Patient assisted to bathroom, gait steady and normal, vomited small amount yellowish green liquid, had small bowel movement, returned to bed, ordered breakfast, applied scd's with explanation for their use.  We discussed patients home med use, the doctors that she wants called in to assist with her care, notified Dr. Clementeen Graham

## 2013-12-01 NOTE — Progress Notes (Signed)
[facility] PROGRESS NOTE  [name] ZSW:[id number] DOB: Sep 28, 1951 DOA: 11/30/2013 PCP: Lilian Coma, MD  Assessment/Plan: Left ureterovesicular stone Left flank pain likely related to 2 days. CT scan of the abdomen shows a  3 mm left ureterovesicular stone which is nonobstructing but shows mild hydroureteronephrosis. Continue IV hydration, pain control with when necessary IV toradol and dilaudid. Antiemetics. Keep patient n.p.o. -Discussed with her urologist Dr. Jasmine December who recommended starting Flomax. -UA suggest UTI added empiric Rocephin. Follow cultures -Monitor WBC  Hyperlipidemia Continue statin  History of bladder cancer  follows with Dr. Jasmine December    Code Status: Full code Family Communication: Husband at bedside  Disposition Plan: Home once improved   Consultants:  Dr Faythe Ghee  Procedures:  None  Antibiotics:  IV rocephin  HPI/Subjective: Has pan over left flank , nauseous and vomiting this am  Objective: Filed Vitals:   12/01/13 0526  BP: 114/69  Pulse: 55  Temp: 97.6 F (36.4 C)  Resp: 16    Intake/Output Summary (Last 24 hours) at 12/01/13 1217 Last data filed at 12/01/13 1000  Gross per 24 hour  Intake    150 ml  Output      0 ml  Net    150 ml   Filed Weights   12/01/13 0234  Weight: 53.8 kg (118 lb 9.7 oz)    Exam:   General:  Elderly thin built female lying in bed appears fatigued with pain and nausea  HEENT: no pallor, dry oral mucosa  Chest: clear b/l, no added sounds  CVS: normal S1&S2, no murmurs, rubs or gallop  Abd: soft, ND, BS+ left CVA tenderness, Mild LLQ tenderness  Ext: warm, no edema   CNS: AAOX3  Data Reviewed: Basic Metabolic Panel:  Recent Labs Lab 11/30/13 1434 12/01/13 0341  NA 141 141  K 4.2 4.4  CL 104 102  CO2 22 23  GLUCOSE 110* 185*  BUN 20 28*  CREATININE 0.65 0.93  CALCIUM 10.0 9.5   Liver Function Tests:  Recent Labs Lab 11/30/13 1434 12/01/13 0341  AST 16 21   ALT 14 13  ALKPHOS 62 56  BILITOT 0.5 0.4  PROT 7.2 6.8  ALBUMIN 4.0 3.9   No results found for this basename: LIPASE, AMYLASE,  in the last 168 hours No results found for this basename: AMMONIA,  in the last 168 hours CBC:  Recent Labs Lab 11/30/13 1434 12/01/13 0113  WBC 9.3 13.8*  NEUTROABS 7.4 11.9*  HGB 14.8 14.2  HCT 42.8 41.5  MCV 91.6 92.0  PLT 168 217   Cardiac Enzymes:  Recent Labs Lab 12/01/13 0341  CKTOTAL 4  TROPONINI <0.30   BNP (last 3 results) No results found for this basename: PROBNP,  in the last 8760 hours CBG: No results found for this basename: GLUCAP,  in the last 168 hours  No results found for this or any previous visit (from the past 240 hour(s)).   Studies: Dg Abd 1 View  11/30/2013   CLINICAL DATA:  Left flank pain.  History of urinary tract stones.  EXAM: ABDOMEN - 1 VIEW  COMPARISON:  CT abdomen and pelvis 11/18/2013.  FINDINGS: Small nonobstructing renal stones seen on CT scan are not well demonstrated on plain film due to extensive gas and stool present in the abdomen. No evidence of ureteral stone is identified. The bowel gas pattern is nonobstructive. No focal bony abnormality is identified.  IMPRESSION: No acute finding. Renal stones seen on CT scan are not well  demonstrated on plain film due to extensive gas and stool.   Electronically Signed   By: Inge Rise M.D.   On: 11/30/2013 23:55   Ct Abdomen Pelvis W Contrast  12/01/2013   CLINICAL DATA:  History of bladder cancer. Left-sided abdominal pain.  EXAM: CT ABDOMEN AND PELVIS WITH CONTRAST  TECHNIQUE: Multidetector CT imaging of the abdomen and pelvis was performed using the standard protocol following bolus administration of intravenous contrast.  CONTRAST:  164mL OMNIPAQUE IOHEXOL 300 MG/ML  SOLN  COMPARISON:  None.  FINDINGS: A 3 mm stone lies within the left ureterovesicular junction. This causes mild left hydronephrosis.  There are nonobstructing stones in both kidneys. There  are bilateral small renal cysts. No right hydronephrosis. Normal right ureter.  The bladder is unremarkable other than the left UVJ stone. No bladder mass is seen.  Mild subsegmental atelectasis noted at the lung bases. Heart is normal in size.  Small liver cysts, stable. Liver is otherwise unremarkable. Normal spleen and gallbladder. No bile duct dilation. There is dilation of the pancreatic duct, stable. Pancreas is otherwise unremarkable. No adrenal masses.  There is a low sigmoid colon anastomosis staple line. Scattered diverticula are noted along the colon and there is mild increased stool throughout the colon. No colonic wall thickening or inflammatory changes are seen. Small bowel is unremarkable. A normal appendix is visualized.  No adenopathy.  No abnormal fluid collections.  Bony structures are unremarkable.  IMPRESSION: 1. 3 mm stone at the left ureterovesicular junction causes mild left hydroureteronephrosis. No other acute finding. 2. Small nonobstructing stones are noted in each kidney. 3. No evidence of a bladder mass. No adenopathy. No evidence of locally invasive for metastatic bladder carcinoma. 4. Liver and renal cysts. 5. Scattered colonic diverticula. No diverticulitis. Mild increased colonic stool.   Electronically Signed   By: Lajean Manes M.D.   On: 12/01/2013 10:31   US Renal  11/30/2013   CLINICAL DATA:  Flank pain  EXAM: RENAL/URINARY TRACT ULTRASOUND COMPLETE  COMPARISON:  CT ABD/PELVIS W CM dated 11/18/2013  FINDINGS: Right Kidney:  Length: 10.2 cm. There is a 4 mm echogenic focus in the upper pole of the right kidney most consistent with nephrolithiasis. There is an 8 mm anechoic right lower pole renal mass most consistent with a cyst. Echogenicity within normal limits. No hydronephrosis visualized.  Left Kidney:  Length: 10.7 cm. There is a 4 mm echogenic focus in the midpole of the left kidney likely representing nephrolithiasis. Echogenicity within normal limits. No hydronephrosis  visualized.  Bladder:  Appears normal for degree of bladder distention.  IMPRESSION: 1. Bilateral nonobstructing nephrolithiasis. 2. No obstructive uropathy.   Electronically Signed   By: Kathreen Devoid   On: 11/30/2013 18:54    Scheduled Meds: . atorvastatin  10 mg Oral QODAY  . cefTRIAXone (ROCEPHIN)  IV  1 g Intravenous Q24H  . tamsulosin  0.4 mg Oral QPC supper  . tobramycin-dexamethasone  1 drop Both Eyes Q6H  . zolpidem  5 mg Oral QHS   Continuous Infusions: . sodium chloride 100 mL/hr at 12/01/13 0559      Time spent: 25 minutes    [name]  [facility] Pager [phone]. If 7PM-7AM, please contact night-coverage at [email or web address], password [email or web address] 12/01/2013, 12:17 PM  LOS: 1 day

## 2013-12-01 NOTE — Progress Notes (Signed)
Attempt made to receive report from ED at 0154. Left message for nurse to call back at 0201 with secretary.

## 2013-12-02 ENCOUNTER — Inpatient Hospital Stay (HOSPITAL_COMMUNITY): Payer: BC Managed Care – PPO

## 2013-12-02 DIAGNOSIS — N201 Calculus of ureter: Principal | ICD-10-CM | POA: Diagnosis present

## 2013-12-02 DIAGNOSIS — N23 Unspecified renal colic: Secondary | ICD-10-CM

## 2013-12-02 LAB — URINE CULTURE

## 2013-12-02 MED ORDER — KETOROLAC TROMETHAMINE 15 MG/ML IJ SOLN: 15.0000 mg | INTRAMUSCULAR | Status: DC | PRN

## 2013-12-02 MED ORDER — HYDROMORPHONE HCL PF 1 MG/ML IJ SOLN: 1.0000 mg | INTRAMUSCULAR | Status: DC | PRN

## 2013-12-02 MED ORDER — TAMSULOSIN HCL 0.4 MG PO CAPS: 0.4000 mg | ORAL_CAPSULE | Freq: Every day | ORAL | Status: DC

## 2013-12-02 NOTE — Progress Notes (Signed)
Instructed patient to continue straining urine, collecting anything that is in strainer.  Provided a strainer and a container with lid for collection

## 2013-12-02 NOTE — Progress Notes (Signed)
Urology Progress Note  Subjective:     No acute urologic events overnight. Straining urine. No stone passage yet. Nausea and pain resolved.  ROS: Negative: SOB or chest pain.  Objective:  Patient Vitals for the past 24 hrs:  BP Temp Temp src Pulse Resp SpO2  12/02/13 0605 97/59 mmHg 98.2 F (36.8 C) Oral 44 16 98 %  12/01/13 2058 112/68 mmHg 98.3 F (36.8 C) Oral 58 17 95 %  12/01/13 1454 - 99 F (37.2 C) Oral - - -  12/01/13 1426 103/56 mmHg 98.2 F (36.8 C) Oral 57 17 96 %    Physical Exam: General:  No acute distress, awake Cardiovascular:    [x]   S1/S2 present, bradycardia (baseline for physically fit patient).  []   Irregularly irregular Chest:  CTA-B Abdomen:               []  Soft, appropriately TTP  [x]  Soft, NTTP, ND.  []  Soft, appropriately TTP, incision(s) clean/dry/intact  Genitourinary: No foley catheter.     I/O last 3 completed shifts: In: 150 [I.V.:150] Out: 1000 [Urine:1000]  Recent Labs     11/30/13  1434  12/01/13  0113  HGB  14.8  14.2  WBC  9.3  13.8*  PLT  168  217    Recent Labs     11/30/13  1434  12/01/13  0341  NA  141  141  K  4.2  4.4  CL  104  102  CO2  22  23  BUN  20  28*  CREATININE  0.65  0.93  CALCIUM  10.0  9.5  GFRNONAA  >90  64*  GFRAA  >90  74*     No results found for this basename: PT, INR, APTT,  in the last 72 hours   No components found with this basename: ABG,     Length of stay: 2 days.  Assessment: Left distal ureter stone (65mm)    Plan: She may have passed the stone already.   Continue to strain urine.  KUB today for comparison for future imaging.  Ok to discharge home on flomax and pain meds.  F/u w/ me in 1-2 weeks where I will repeat KUB & renal US.   Rolan Bucco, MD [phone]

## 2013-12-02 NOTE — Discharge Summary (Addendum)
Physician Discharge Summary  [name] [id number] DOB: 11/27/1950 DOA: 11/30/2013  PCP: Lilian Coma, MD  Admit date: 11/30/2013 Discharge date: 12/02/2013  Time spent: 40 minutes  Recommendations for Outpatient Follow-up:  Home with outpt PCP and urology follow up  Discharge Diagnoses:   Principal Problem:   Ureteric colic   Active Problems:   Left flank pain   HLD (hyperlipidemia)   History of bladder cancer   Ureteric stone, left   Discharge Condition: Fair  Diet recommendation: Regular  Filed Weights   12/01/13 0234  Weight: 53.8 kg (118 lb 9.7 oz)    History of present illness:  Please refer to admission H&P for details but in brief 63 year old female with history of bladder cancer, hyperlipidemia presented with acute onset of left flank pain since one day. She had presented to the ED about 2 weeks back with similar pain and had a CT scan of the abdomen done which showed a small nonobstructing bilateral renal stones and was discharged home. Patient was asymptomatic for about 10 days but again had similar symptoms. She did not have any fever, chills, diarrhea. Patient reported having several episodes of nausea and vomiting. Should admitted for possible renal colic.  Hospital Course:  Left ureterovesicular stone  Left flank pain likely related to this. CT scan of the abdomen and pelvis repeated which shows a 3 mm left ureterovesicular stone which is nonobstructing but shows mild hydroureteronephrosis.  -Patient started on  IV hydration, pain control with when necessary IV toradol and dilaudid. And Antiemetics. -Discussed with her urologist Dr. Jasmine December who recommended starting Flomax. A KUB was done this morning which does not show presence of the ureteral bone and possibly could have passed it out. -UA suggest UTI but was negative and patient asymptomatic.  discontinued antibiotics. -Urology recommends followup with him in 2 weeks repeat KUB and renal ultrasound .  Patient to be discharged on Flomax and can continue when necessary toradol  that she was prescribed in the ED recently. She has not required pain medication for past 12 hours. Patient encouraged to drink plenty of fluids and continue to strain urine.  Hyperlipidemia  Continue statin   History of bladder cancer  follows with Dr. Jasmine December   Code Status: Full code  Family Communication: Husband at bedside  Disposition Plan: Home    Consultants:  Dr Faythe Ghee Procedures:  None Antibiotics:  IV rocephin( 2/2-2/3)        Discharge Exam: Filed Vitals:   12/02/13 0605  BP: 97/59  Pulse: 44  Temp: 98.2 F (36.8 C)  Resp: 16   General: Elderly thin built female lying in bed in no acute distress  HEENT: no pallor, moist oral mucosa  Chest: clear b/l, no added sounds  CVS: normal S1&S2, no murmurs, rubs or gallop  Abd: soft, ND, BS+ no CVA or left lower quadrant tenderness  Ext: warm, no edema  CNS: AAOX3   Discharge Instructions     Medication List    STOP taking these medications       azithromycin 250 MG tablet  Commonly known as:  ZITHROMAX      TAKE these medications       atorvastatin 10 MG tablet  Commonly known as:  LIPITOR  Take 10 mg by mouth every other day.     beta carotene w/minerals tablet  Take 1 tablet by mouth daily.     FORTICAL 200 UNIT/ACT nasal spray  Generic drug:  calcitonin (salmon)  Place 1 spray into  alternate nostrils daily.     ketorolac 10 MG tablet  Commonly known as:  TORADOL  Take 1 tablet (10 mg total) by mouth every 6 (six) hours as needed.     ondansetron 8 MG disintegrating tablet  Commonly known as:  ZOFRAN ODT  8mg  ODT q4 hours prn nausea     tamsulosin 0.4 MG Caps capsule  Commonly known as:  FLOMAX  Take 1 capsule (0.4 mg total) by mouth daily after supper.     tobramycin-dexamethasone ophthalmic solution  Commonly known as:  TOBRADEX  Place 1 drop into both eyes every 6 (six) hours. For 7 days      zolpidem 10 MG tablet  Commonly known as:  AMBIEN  Take 10 mg by mouth at bedtime.       Allergies  Allergen Reactions  . Hydrocodone-Acetaminophen     REACTION: nightmares  . Moxifloxacin     REACTION: rash, itching  . Pseudoephedrine     REACTION: makes heart race  . Sulfonamide Derivatives     REACTION: hives       Follow-up Information   Schedule an appointment as soon as possible for a visit with Molli Hazard, MD. (1-2 weeks from now.)    Specialty:  Urology   Contact information:   [facility]  [place] [id number] [phone]       Follow up with Lilian Coma, MD. Schedule an appointment as soon as possible for a visit in 1 week.   Specialty:  Family Medicine   Contact information:   [place] [place] [address] [phone]        The results of significant diagnostics from this hospitalization (including imaging, microbiology, ancillary and laboratory) are listed below for reference.    Significant Diagnostic Studies: Dg Abd 1 View  12/02/2013   CLINICAL DATA:  Evaluate for distal left ureteral stone.  EXAM: ABDOMEN - 1 VIEW  COMPARISON:  CT, 12/01/2013  FINDINGS: There are multiple small nonobstructing stones in the kidneys. There is a small round density in the left inferior and lateral pelvis that could reflect a distal ureteral stone. However, this is more lateral than expected for the stones seen on CT, and therefore most likely a phlebolith. The left ureterovesicular junction stone noted on the previous CT is not confidently seen radiographically.  Normal bowel gas pattern. Mild increased stool is noted throughout the colon.  No other soft tissue abnormality  IMPRESSION: 1. The distal left ureteral stone noted on the previous day CT is not confidently seen radiographically. It may have passed. 2. Bilateral intrarenal stones are noted.   Electronically Signed   By: Lajean Manes  M.D.   On: 12/02/2013 10:21   Dg Abd 1 View  12/01/2013   CLINICAL DATA:  Nephrolithiasis with distal left ureteral calculus.  EXAM: ABDOMEN - 1 VIEW  COMPARISON:  CT ABD/PELVIS W CM dated 12/01/2013; DG ABDOMEN 1V dated 11/30/2013  FINDINGS: The small calculus located at the left ureterovesical junction by CT is not visible and likely is obscured by contrast presently in the bladder. Renal collecting systems are partially opacified with excreted contrast material from CT earlier today and show no evidence of significant hydronephrosis. The bowel gas pattern is unremarkable.  IMPRESSION: Lack of visualization of the distal left ureteral calculus due to contrast excretion into the bladder. There does not appear to be significant hydronephrosis based on appearance of excreted contrast material in the  left renal collecting system.   Electronically Signed   By: Aletta Edouard M.D.   On: 12/01/2013 17:19   Dg Abd 1 View  11/30/2013   CLINICAL DATA:  Left flank pain.  History of urinary tract stones.  EXAM: ABDOMEN - 1 VIEW  COMPARISON:  CT abdomen and pelvis 11/18/2013.  FINDINGS: Small nonobstructing renal stones seen on CT scan are not well demonstrated on plain film due to extensive gas and stool present in the abdomen. No evidence of ureteral stone is identified. The bowel gas pattern is nonobstructive. No focal bony abnormality is identified.  IMPRESSION: No acute finding. Renal stones seen on CT scan are not well demonstrated on plain film due to extensive gas and stool.   Electronically Signed   By: Inge Rise M.D.   On: 11/30/2013 23:55   Ct Abdomen Pelvis W Contrast  12/01/2013   CLINICAL DATA:  History of bladder cancer. Left-sided abdominal pain.  EXAM: CT ABDOMEN AND PELVIS WITH CONTRAST  TECHNIQUE: Multidetector CT imaging of the abdomen and pelvis was performed using the standard protocol following bolus administration of intravenous contrast.  CONTRAST:  112mL OMNIPAQUE IOHEXOL 300 MG/ML  SOLN   COMPARISON:  None.  FINDINGS: A 3 mm stone lies within the left ureterovesicular junction. This causes mild left hydronephrosis.  There are nonobstructing stones in both kidneys. There are bilateral small renal cysts. No right hydronephrosis. Normal right ureter.  The bladder is unremarkable other than the left UVJ stone. No bladder mass is seen.  Mild subsegmental atelectasis noted at the lung bases. Heart is normal in size.  Small liver cysts, stable. Liver is otherwise unremarkable. Normal spleen and gallbladder. No bile duct dilation. There is dilation of the pancreatic duct, stable. Pancreas is otherwise unremarkable. No adrenal masses.  There is a low sigmoid colon anastomosis staple line. Scattered diverticula are noted along the colon and there is mild increased stool throughout the colon. No colonic wall thickening or inflammatory changes are seen. Small bowel is unremarkable. A normal appendix is visualized.  No adenopathy.  No abnormal fluid collections.  Bony structures are unremarkable.  IMPRESSION: 1. 3 mm stone at the left ureterovesicular junction causes mild left hydroureteronephrosis. No other acute finding. 2. Small nonobstructing stones are noted in each kidney. 3. No evidence of a bladder mass. No adenopathy. No evidence of locally invasive for metastatic bladder carcinoma. 4. Liver and renal cysts. 5. Scattered colonic diverticula. No diverticulitis. Mild increased colonic stool.   Electronically Signed   By: Lajean Manes M.D.   On: 12/01/2013 10:31   Ct Abdomen Pelvis W Contrast  11/18/2013   CLINICAL DATA:  Lower abdominal pain, back pain. History of prior colon resection for diverticulitis. History of bladder cancer with resection.  EXAM: CT ABDOMEN AND PELVIS WITH CONTRAST  TECHNIQUE: Multidetector CT imaging of the abdomen and pelvis was performed using the standard protocol following bolus administration of intravenous contrast.  CONTRAST:  140mL OMNIPAQUE IOHEXOL 300 MG/ML  SOLN   COMPARISON:  CT ABD/PELV WO CM dated 07/29/2012  FINDINGS: Lung bases are clear. No effusions. Heart is normal size.  Small hypodensity centrally within the liver is stable, most compatible with small cyst. Gallbladder, pancreas, adrenals are unremarkable. There are scattered small hypodensities in the spleen. These cannot be visualized on the prior noncontrast study. These are nonspecific, but most likely represent small cysts or hemangiomas.  There are small bilateral nonobstructing renal stones small bilateral renal cortical cysts which appear benign. No ureteral  stones or hydronephrosis.  Uterus, adnexae and urinary bladder are unremarkable. Postsurgical changes within the sigmoid colon. Stomach, large and small bowel otherwise unremarkable. No free fluid, free air or adenopathy. No acute bony abnormality.  IMPRESSION: Small nonobstructing bilateral renal stones. No ureteral stones or hydronephrosis.  Small bilateral benign appearing renal cysts.  Scattered small hypodensities in the spleen, most likely small cysts or hemangiomas  No acute findings in the abdomen or pelvis. No explanation for the patient's lower abdominal pain or back pain.   Electronically Signed   By: Rolm Baptise M.D.   On: 11/18/2013 13:07   US Renal  11/30/2013   CLINICAL DATA:  Flank pain  EXAM: RENAL/URINARY TRACT ULTRASOUND COMPLETE  COMPARISON:  CT ABD/PELVIS W CM dated 11/18/2013  FINDINGS: Right Kidney:  Length: 10.2 cm. There is a 4 mm echogenic focus in the upper pole of the right kidney most consistent with nephrolithiasis. There is an 8 mm anechoic right lower pole renal mass most consistent with a cyst. Echogenicity within normal limits. No hydronephrosis visualized.  Left Kidney:  Length: 10.7 cm. There is a 4 mm echogenic focus in the midpole of the left kidney likely representing nephrolithiasis. Echogenicity within normal limits. No hydronephrosis visualized.  Bladder:  Appears normal for degree of bladder distention.   IMPRESSION: 1. Bilateral nonobstructing nephrolithiasis. 2. No obstructive uropathy.   Electronically Signed   By: Kathreen Devoid   On: 11/30/2013 18:54    Microbiology: Recent Results (from the past 240 hour(s))  URINE CULTURE     Status: None   Collection Time    12/01/13  7:10 AM      Result Value Range Status   Specimen Description URINE, CLEAN CATCH   Final   Special Requests NONE   Final   Culture  Setup Time     Final   Value: 12/01/2013 12:08     Performed at [facility]     Final   Value: 4,000 COLONIES/ML     Performed at [facility]   Culture     Final   Value: INSIGNIFICANT GROWTH     Performed at [facility]   Report Status 12/02/2013 FINAL   Final     Labs: Basic Metabolic Panel:  Recent Labs Lab 11/30/13 1434 12/01/13 0341  NA 141 141  K 4.2 4.4  CL 104 102  CO2 22 23  GLUCOSE 110* 185*  BUN 20 28*  CREATININE 0.65 0.93  CALCIUM 10.0 9.5   Liver Function Tests:  Recent Labs Lab 11/30/13 1434 12/01/13 0341  AST 16 21  ALT 14 13  ALKPHOS 62 56  BILITOT 0.5 0.4  PROT 7.2 6.8  ALBUMIN 4.0 3.9   No results found for this basename: LIPASE, AMYLASE,  in the last 168 hours No results found for this basename: AMMONIA,  in the last 168 hours CBC:  Recent Labs Lab 11/30/13 1434 12/01/13 0113  WBC 9.3 13.8*  NEUTROABS 7.4 11.9*  HGB 14.8 14.2  HCT 42.8 41.5  MCV 91.6 92.0  PLT 168 217   Cardiac Enzymes:  Recent Labs Lab 12/01/13 0341  CKTOTAL 64  TROPONINI <0.30   BNP: BNP (last 3 results) No results found for this basename: PROBNP,  in the last 8760 hours CBG: No results found for this basename: GLUCAP,  in the last 168 hours     Signed:  Othella Slappey, [place]  [facility] 12/02/2013, 11:19 AM

## 2013-12-02 NOTE — Progress Notes (Signed)
Pt has slept all night.  Has not asked for any pain medication or nausea medication.  IV patent will d/c after this bag is complete.  Up going to bathroom without assistance.  We are straining all urine but so far have not seen any stones.

## 2013-12-02 NOTE — Discharge Instructions (Signed)
Kidney Stones Kidney stones (urolithiasis) are solid masses that form inside your kidneys. The intense pain is caused by the stone moving through the kidney, ureter, bladder, and urethra (urinary tract). When the stone moves, the ureter starts to spasm around the stone. The stone is usually passed in your pee (urine).  HOME CARE  Drink enough fluids to keep your pee clear or pale yellow. This helps to get the stone out.  Strain all pee through the provided strainer. Do not pee without peeing through the strainer, not even once. If you pee the stone out, catch it in the strainer. The stone may be as small as a grain of salt. Take this to your doctor. This will help your doctor figure out what you can do to try to prevent more kidney stones.  Only take medicine as told by your doctor.  Follow up with your doctor as told.  Get follow-up X-rays as told by your doctor. GET HELP IF: You have pain that gets worse even if you have been taking pain medicine. GET HELP RIGHT AWAY IF:   Your pain does not get better with medicine.  You have a fever or shaking chills.  Your pain increases and gets worse over 18 hours.  You have new belly (abdominal) pain.  You feel faint or pass out.  You are unable to pee. MAKE SURE YOU:   Understand these instructions.  Will watch your condition.  Will get help right away if you are not doing well or get worse. Document Released: 04/03/2008 Document Revised: 06/18/2013 Document Reviewed: 03/19/2013 [employer] Patient Information 2014 [employer].  

## 2013-12-24 ENCOUNTER — Other Ambulatory Visit: Payer: Self-pay | Admitting: Family Medicine

## 2013-12-24 DIAGNOSIS — N6489 Other specified disorders of breast: Secondary | ICD-10-CM

## 2014-02-06 ENCOUNTER — Ambulatory Visit
Admission: RE | Admit: 2014-02-06 | Discharge: 2014-02-06 | Disposition: A | Discharge status: Home / Self Care | Payer: Self-pay | Source: Ambulatory Visit | Attending: Family Medicine | Admitting: Family Medicine

## 2014-02-06 DIAGNOSIS — N6489 Other specified disorders of breast: Secondary | ICD-10-CM

## 2014-03-31 ENCOUNTER — Emergency Department (HOSPITAL_COMMUNITY): Payer: BC Managed Care – PPO

## 2014-03-31 ENCOUNTER — Encounter (HOSPITAL_COMMUNITY): Payer: Self-pay | Admitting: Emergency Medicine

## 2014-03-31 ENCOUNTER — Emergency Department (HOSPITAL_COMMUNITY)
Admission: EM | Admit: 2014-03-31 | Discharge: 2014-03-31 | Disposition: A | Discharge status: Home / Self Care | Payer: BC Managed Care – PPO | Attending: Emergency Medicine | Admitting: Emergency Medicine

## 2014-03-31 DIAGNOSIS — E7800 Pure hypercholesterolemia, unspecified: Secondary | ICD-10-CM | POA: Insufficient documentation

## 2014-03-31 DIAGNOSIS — Z8551 Personal history of malignant neoplasm of bladder: Secondary | ICD-10-CM | POA: Insufficient documentation

## 2014-03-31 DIAGNOSIS — R0789 Other chest pain: Secondary | ICD-10-CM

## 2014-03-31 DIAGNOSIS — R42 Dizziness and giddiness: Secondary | ICD-10-CM | POA: Insufficient documentation

## 2014-03-31 DIAGNOSIS — M47817 Spondylosis without myelopathy or radiculopathy, lumbosacral region: Secondary | ICD-10-CM | POA: Insufficient documentation

## 2014-03-31 DIAGNOSIS — Z79899 Other long term (current) drug therapy: Secondary | ICD-10-CM | POA: Insufficient documentation

## 2014-03-31 DIAGNOSIS — Z791 Long term (current) use of non-steroidal anti-inflammatories (NSAID): Secondary | ICD-10-CM | POA: Insufficient documentation

## 2014-03-31 DIAGNOSIS — Z792 Long term (current) use of antibiotics: Secondary | ICD-10-CM | POA: Insufficient documentation

## 2014-03-31 LAB — CBC
HCT: 39.9 % (ref 36.0–46.0)
Hemoglobin: 13.8 g/dL (ref 12.0–15.0)
MCH: 31.5 pg (ref 26.0–34.0)
MCHC: 34.6 g/dL (ref 30.0–36.0)
MCV: 91.1 fL (ref 78.0–100.0)
Platelets: 212 10*3/uL (ref 150–400)
RBC: 4.38 MIL/uL (ref 3.87–5.11)
RDW: 12.6 % (ref 11.5–15.5)
WBC: 5 10*3/uL (ref 4.0–10.5)

## 2014-03-31 LAB — BASIC METABOLIC PANEL
BUN: 20 mg/dL (ref 6–23)
CO2: 23 mEq/L (ref 19–32)
Calcium: 9.9 mg/dL (ref 8.4–10.5)
Chloride: 103 mEq/L (ref 96–112)
Creatinine, Ser: 0.6 mg/dL (ref 0.50–1.10)
Glucose, Bld: 105 mg/dL — ABNORMAL HIGH (ref 70–99)
POTASSIUM: 4.4 meq/L (ref 3.7–5.3)
SODIUM: 140 meq/L (ref 137–147)

## 2014-03-31 LAB — I-STAT TROPONIN, ED: TROPONIN I, POC: 0 ng/mL (ref 0.00–0.08)

## 2014-03-31 LAB — TROPONIN I

## 2014-03-31 NOTE — Discharge Instructions (Signed)
Chest Pain (Nonspecific) °It is often hard to give a specific diagnosis for the cause of chest pain. There is always a chance that your pain could be related to something serious, such as a heart attack or a blood clot in the lungs. You need to follow up with your caregiver for further evaluation. °CAUSES  °· Heartburn. °· Pneumonia or bronchitis. °· Anxiety or stress. °· Inflammation around your heart (pericarditis) or lung (pleuritis or pleurisy). °· A blood clot in the lung. °· A collapsed lung (pneumothorax). It can develop suddenly on its own (spontaneous pneumothorax) or from injury (trauma) to the chest. °· Shingles infection (herpes zoster virus). °The chest wall is composed of bones, muscles, and cartilage. Any of these can be the source of the pain. °· The bones can be bruised by injury. °· The muscles or cartilage can be strained by coughing or overwork. °· The cartilage can be affected by inflammation and become sore (costochondritis). °DIAGNOSIS  °Lab tests or other studies, such as X-rays, electrocardiography, stress testing, or cardiac imaging, may be needed to find the cause of your pain.  °TREATMENT  °· Treatment depends on what may be causing your chest pain. Treatment may include: °· Acid blockers for heartburn. °· Anti-inflammatory medicine. °· Pain medicine for inflammatory conditions. °· Antibiotics if an infection is present. °· You may be advised to change lifestyle habits. This includes stopping smoking and avoiding alcohol, caffeine, and chocolate. °· You may be advised to keep your head raised (elevated) when sleeping. This reduces the chance of acid going backward from your stomach into your esophagus. °· Most of the time, nonspecific chest pain will improve within 2 to 3 days with rest and mild pain medicine. °HOME CARE INSTRUCTIONS  °· If antibiotics were prescribed, take your antibiotics as directed. Finish them even if you start to feel better. °· For the next few days, avoid physical  activities that bring on chest pain. Continue physical activities as directed. °· Do not smoke. °· Avoid drinking alcohol. °· Only take over-the-counter or prescription medicine for pain, discomfort, or fever as directed by your caregiver. °· Follow your caregiver's suggestions for further testing if your chest pain does not go away. °· Keep any follow-up appointments you made. If you do not go to an appointment, you could develop lasting (chronic) problems with pain. If there is any problem keeping an appointment, you must call to reschedule. °SEEK MEDICAL CARE IF:  °· You think you are having problems from the medicine you are taking. Read your medicine instructions carefully. °· Your chest pain does not go away, even after treatment. °· You develop a rash with blisters on your chest. °SEEK IMMEDIATE MEDICAL CARE IF:  °· You have increased chest pain or pain that spreads to your arm, neck, jaw, back, or abdomen. °· You develop shortness of breath, an increasing cough, or you are coughing up blood. °· You have severe back or abdominal pain, feel nauseous, or vomit. °· You develop severe weakness, fainting, or chills. °· You have a fever. °THIS IS AN EMERGENCY. Do not wait to see if the pain will go away. Get medical help at once. Call your local emergency services (911 in [place] Do not drive yourself to the hospital. °MAKE SURE YOU:  °· Understand these instructions. °· Will watch your condition. °· Will get help right away if you are not doing well or get worse. °Document Released: 07/26/2005 Document Revised: 01/08/2012 Document Reviewed: 05/21/2008 °ExitCare® Patient Information ©2014 [employer],   LLC. ° °

## 2014-03-31 NOTE — ED Provider Notes (Signed)
CSN: [id number]     Arrival date & time 03/31/14  1527 History   First MD Initiated Contact with Patient 03/31/14 1614     Chief Complaint  Patient presents with  . Chest Pain  . Dizziness     (Consider location/radiation/quality/duration/timing/severity/associated sxs/prior Treatment) Patient is a 63 y.o. female presenting with chest pain and dizziness. The history is provided by the patient.  Chest Pain Pain location:  Epigastric Pain quality: sharp   Pain radiates to:  Neck Pain radiates to the back: no   Pain severity:  Mild Duration:  2 minutes Timing:  Intermittent Progression:  Resolved Chronicity:  New Context: at rest   Context: not breathing, not eating and no stress   Relieved by:  Nothing Worsened by:  Nothing tried Ineffective treatments:  None tried Associated symptoms: dizziness   Associated symptoms: no cough, no fever and not vomiting   Dizziness Associated symptoms: chest pain   Associated symptoms: no vomiting    pt had 2 episodes of sharp epigastric pain at rest--1st lasted 2 minutes and second episode lasted less than 30 seconds--no tx used pta and nothing made the sx better or worse  Past Medical History  Diagnosis Date  . Elevated cholesterol   . Bladder tumor     HEMATURIA - UROLOGICAL WORK UP - FOUND TO HAVE BLADDER TUMORS AND KIDNEY STONE  . Headache(784.0)     MIGRAINES  . Arthritis     LUMBAR SPINE / BACK PAIN  . Cancer     bladder cancer   Past Surgical History  Procedure Laterality Date  . C-sections      X 1  . Orif left elbow    . 2nd left elbow surgery to remove bone chips    . Left shoulder surgery to repair torn ligament    . Orif left foot    . Colon surgery      SIGMOID COLECTOMY  FOR DIVERTICULITS  . Blepharoplasty - bilateral    . Dilation and curettage of uterus    . Transurethral resection of bladder tumor with gyrus (turbt-gyrus) N/A 06/19/2013    Procedure: TRANSURETHRAL RESECTION OF BLADDER TUMOR WITH GYRUS  (TURBT-GYRUS);  Surgeon: Molli Hazard, MD;  Location: [facility];  Service: Urology;  Laterality: N/A;  . Cystoscopy w/ retrogrades Bilateral 06/19/2013    Procedure: CYSTOSCOPY WITH RETROGRADE PYELOGRAM;  Surgeon: Molli Hazard, MD;  Location: [facility];  Service: Urology;  Laterality: Bilateral;   Family History  Problem Relation Age of Onset  . Hypertension Brother    History  Substance Use Topics  . Smoking status: Never Smoker   . Smokeless tobacco: Never Used  . Alcohol Use: No   OB History   Grav Para Term Preterm Abortions TAB SAB Ect Mult Living                 Review of Systems  Constitutional: Negative for fever.  Respiratory: Negative for cough.   Cardiovascular: Positive for chest pain.  Gastrointestinal: Negative for vomiting.  Neurological: Positive for dizziness.  All other systems reviewed and are negative.     Allergies  Hydrocodone-acetaminophen; Moxifloxacin; Pseudoephedrine; and Sulfonamide derivatives  Home Medications   Prior to Admission medications   Medication Sig Start Date End Date Taking? Authorizing Provider  atorvastatin (LIPITOR) 10 MG tablet Take 10 mg by mouth every other day.    Historical Provider, MD  beta carotene w/minerals (OCUVITE) tablet Take 1 tablet by mouth daily.    Historical  Provider, MD  FORTICAL 200 UNIT/ACT nasal spray Place 1 spray into alternate nostrils daily.  11/20/13   Historical Provider, MD  ketorolac (TORADOL) 10 MG tablet Take 1 tablet (10 mg total) by mouth every 6 (six) hours as needed. 11/30/13   Hannah Muthersbaugh, PA-C  ondansetron (ZOFRAN ODT) 8 MG disintegrating tablet 8mg  ODT q4 hours prn nausea 11/30/13   Jarrett Soho Muthersbaugh, PA-C  tamsulosin (FLOMAX) 0.4 MG CAPS capsule Take 1 capsule (0.4 mg total) by mouth daily after supper. 12/02/13   Nishant Dhungel, MD  tobramycin-dexamethasone (TOBRADEX) ophthalmic solution Place 1 drop into both eyes every 6 (six) hours. For 7 days 11/26/13   Historical  Provider, MD  zolpidem (AMBIEN) 10 MG tablet Take 10 mg by mouth at bedtime.  11/24/13   Historical Provider, MD   BP 127/72  Pulse 56  Temp(Src) 98.2 F (36.8 C)  Resp 18  Wt 123 lb (55.792 kg)  SpO2 98% Physical Exam  Nursing note and vitals reviewed. Constitutional: She is oriented to person, place, and time. She appears well-developed and well-nourished.  Non-toxic appearance. No distress.  HENT:  Head: Normocephalic and atraumatic.  Eyes: Conjunctivae, EOM and lids are normal. Pupils are equal, round, and reactive to light.  Neck: Normal range of motion. Neck supple. No tracheal deviation present. No mass present.  Cardiovascular: Normal rate, regular rhythm and normal heart sounds.  Exam reveals no gallop.   No murmur heard. Pulmonary/Chest: Effort normal and breath sounds normal. No stridor. No respiratory distress. She has no decreased breath sounds. She has no wheezes. She has no rhonchi. She has no rales.  Abdominal: Soft. Normal appearance and bowel sounds are normal. She exhibits no distension. There is no tenderness. There is no rebound and no CVA tenderness.  Musculoskeletal: Normal range of motion. She exhibits no edema and no tenderness.  Neurological: She is alert and oriented to person, place, and time. She has normal strength. No cranial nerve deficit or sensory deficit. GCS eye subscore is 4. GCS verbal subscore is 5. GCS motor subscore is 6.  Skin: Skin is warm and dry. No abrasion and no rash noted.  Psychiatric: She has a normal mood and affect. Her speech is normal and behavior is normal.    ED Course  Procedures (including critical care time) Labs Review Labs Reviewed  [place], ED    Imaging Review No results found.   EKG Interpretation   Date/Time:  Tuesday March 31 2014 15:35:56 EDT Ventricular Rate:  55 PR Interval:  150 QRS Duration: 82 QT Interval:  428 QTC Calculation: 409 R Axis:   -53 Text  Interpretation:  Sinus bradycardia Left anterior fascicular block  Septal infarct , age undetermined Abnormal ECG No significant change since  last tracing Confirmed by Juanito Gonyer  MD, Katalyna Socarras ([id number]) on 03/31/2014 4:14:50  PM      MDM   Final diagnoses:  None    Patient has had 2 negative troponins here. Her EKG is improved from her prior studies. No concern for ACS. She is to followup with her physician.    Leota Jacobsen, MD 03/31/14 [phone]

## 2014-03-31 NOTE — ED Notes (Addendum)
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 Denies any pain now or dizziness. St sit was a sharp pain that lasted for a few minutes and she couldn't swallow. Pt sent here by her doctor for abnormal EKG

## 2014-07-13 ENCOUNTER — Other Ambulatory Visit: Payer: Self-pay | Admitting: Family Medicine

## 2014-07-13 DIAGNOSIS — N6489 Other specified disorders of breast: Secondary | ICD-10-CM

## 2014-07-23 ENCOUNTER — Ambulatory Visit
Admission: RE | Admit: 2014-07-23 | Discharge: 2014-07-23 | Disposition: A | Discharge status: Home / Self Care | Payer: BC Managed Care – PPO | Source: Ambulatory Visit | Attending: Family Medicine | Admitting: Family Medicine

## 2014-07-23 ENCOUNTER — Encounter (INDEPENDENT_AMBULATORY_CARE_PROVIDER_SITE_OTHER): Payer: Self-pay

## 2014-07-23 DIAGNOSIS — N6489 Other specified disorders of breast: Secondary | ICD-10-CM

## 2014-08-21 ENCOUNTER — Other Ambulatory Visit: Payer: Self-pay | Admitting: Gastroenterology

## 2014-08-24 ENCOUNTER — Encounter (HOSPITAL_COMMUNITY)
Admission: RE | Disposition: A | Discharge status: Home / Self Care | Payer: Self-pay | Source: Ambulatory Visit | Attending: Gastroenterology

## 2014-08-24 ENCOUNTER — Ambulatory Visit (HOSPITAL_COMMUNITY)
Admission: RE | Admit: 2014-08-24 | Discharge: 2014-08-24 | Disposition: A | Discharge status: Home / Self Care | Payer: BC Managed Care – PPO | Source: Ambulatory Visit | Attending: Gastroenterology | Admitting: Gastroenterology

## 2014-08-24 ENCOUNTER — Encounter (HOSPITAL_COMMUNITY): Payer: Self-pay | Admitting: *Deleted

## 2014-08-24 DIAGNOSIS — R1013 Epigastric pain: Secondary | ICD-10-CM | POA: Diagnosis present

## 2014-08-24 DIAGNOSIS — K2950 Unspecified chronic gastritis without bleeding: Secondary | ICD-10-CM | POA: Insufficient documentation

## 2014-08-24 DIAGNOSIS — K219 Gastro-esophageal reflux disease without esophagitis: Secondary | ICD-10-CM | POA: Diagnosis present

## 2014-08-24 DIAGNOSIS — K449 Diaphragmatic hernia without obstruction or gangrene: Secondary | ICD-10-CM | POA: Diagnosis not present

## 2014-08-24 HISTORY — PX: ESOPHAGOGASTRODUODENOSCOPY: SHX5428

## 2014-08-24 HISTORY — PX: BRAVO PH STUDY: SHX5421

## 2014-08-24 SURGERY — EGD (ESOPHAGOGASTRODUODENOSCOPY)
Anesthesia: Moderate Sedation

## 2014-08-24 MED ORDER — DIPHENHYDRAMINE HCL 50 MG/ML IJ SOLN
INTRAMUSCULAR | Status: AC
  Filled 2014-08-24: qty 1

## 2014-08-24 MED ORDER — DIPHENHYDRAMINE HCL 50 MG/ML IJ SOLN
INTRAMUSCULAR | Status: DC | PRN
  Administered 2014-08-24: 12.5 mg via INTRAVENOUS

## 2014-08-24 MED ORDER — FENTANYL CITRATE 0.05 MG/ML IJ SOLN
INTRAMUSCULAR | Status: DC | PRN
  Administered 2014-08-24 (×2): 25 ug via INTRAVENOUS

## 2014-08-24 MED ORDER — MIDAZOLAM HCL 10 MG/2ML IJ SOLN
INTRAMUSCULAR | Status: DC | PRN
  Administered 2014-08-24: 2 mg via INTRAVENOUS
  Administered 2014-08-24: 1 mg via INTRAVENOUS
  Administered 2014-08-24: 2 mg via INTRAVENOUS

## 2014-08-24 MED ORDER — MIDAZOLAM HCL 10 MG/2ML IJ SOLN
INTRAMUSCULAR | Status: AC
  Filled 2014-08-24: qty 2

## 2014-08-24 MED ORDER — SODIUM CHLORIDE 0.9 % IV SOLN
INTRAVENOUS | Status: DC
  Administered 2014-08-24: 500 mL via INTRAVENOUS

## 2014-08-24 MED ORDER — FENTANYL CITRATE 0.05 MG/ML IJ SOLN
INTRAMUSCULAR | Status: AC
  Filled 2014-08-24: qty 2

## 2014-08-24 MED ORDER — BUTAMBEN-TETRACAINE-BENZOCAINE 2-2-14 % EX AERO
INHALATION_SPRAY | CUTANEOUS | Status: DC | PRN
  Administered 2014-08-24: 2 via TOPICAL

## 2014-08-24 NOTE — Discharge Instructions (Addendum)
Esophagogastroduodenoscopy °Care After °Refer to this sheet in the next few weeks. These instructions provide you with information on caring for yourself after your procedure. Your caregiver may also give you more specific instructions. Your treatment has been planned according to current medical practices, but problems sometimes occur. Call your caregiver if you have any problems or questions after your procedure.  °HOME CARE INSTRUCTIONS °· Do not eat or drink anything until the numbing medicine (local anesthetic) has worn off and your gag reflex has returned. You will know that the local anesthetic has worn off when you can swallow comfortably. °· Do not drive for 12 hours after the procedure or as directed by your caregiver. °· Only take medicines as directed by your caregiver. °SEEK MEDICAL CARE IF:  °· You cannot stop coughing. °· You are not urinating at all or less than usual. °SEEK IMMEDIATE MEDICAL CARE IF: °· You have difficulty swallowing. °· You cannot eat or drink. °· You have worsening throat or chest pain. °· You have dizziness, lightheadedness, or you faint. °· You have nausea or vomiting. °· You have chills. °· You have a fever. °· You have severe abdominal pain. °· You have black, tarry, or bloody stools. °Document Released: 10/02/2012 Document Reviewed: 10/02/2012 °ExitCare® Patient Information ©2015 [employer]. This information is not intended to replace advice given to you by your health care provider. Make sure you discuss any questions you have with your health care provider. ° °

## 2014-08-24 NOTE — Op Note (Signed)
[facility] [place], [id number]   ENDOSCOPY PROCEDURE REPORT  PATIENT: [name], [name]  MR#: [id number] BIRTHDATE: 10/19/1951 , 63  yrs. old GENDER: female ENDOSCOPIST: Wilford Corner, MD REFERRED BY: PROCEDURE DATE:  08/24/2014 PROCEDURE:  EGD w/ Bravo capsule placement and EGD w/ biopsy ASA CLASS:     Class II INDICATIONS:  GERD; Epigastric pain. MEDICATIONS: Fentanyl 50 mcg IV, Versed 5 mg IV, and Benadryl 12.5 mg IV TOPICAL ANESTHETIC: Cetacaine spray  DESCRIPTION OF PROCEDURE: After the risks benefits and alternatives of the procedure were thoroughly explained, informed consent was obtained.  The    endoscope was introduced through the mouth and advanced to the second portion of the duodenum , Without limitations.  The instrument was slowly withdrawn as the mucosa was fully examined.    Esophagus normal in appearance. Endoscope advanced into the stomach, which revealed a tiny gastric erosion in the antrum with a segmental area of erythema. The duodenal bulb and 2nd part of the duodenum were normal in appearance. The endoscope was withdrawn back into the stomach and retroflexion revealed a small hiatal hernia.   A biopsy was taken of the distal stomach to send for histology.    The GEJ noted at 39 cm from the incisors. The endoscope was withdrawn and the Bravo capsule catheter was then inserted and advanced into the esophagus for deploying the catheter at 33 cm from the incisors. The endoscope was reinserted and the Bravo capsule was noted to be in good position. A small amount of bleeding noted at the site of the Bravo capsule that resolved spontaneously.         The scope was then withdrawn from the patient and the procedure completed.  COMPLICATIONS: There were no immediate complications.  ENDOSCOPIC IMPRESSION:     1. Antral gastritis 2. Small hiatal hernia 3. S/P Bravo capsule placement  RECOMMENDATIONS:     F/U on path; F/U on  Bravo capsule results (procedure done OFF of PPI's   eSigned:  Wilford Corner, MD 08/24/2014 1:24 PM    UE:KCMKLK Stephanie Acre, MD  CPT CODES: ICD CODES:  The ICD and CPT codes recommended by this software are interpretations from the data that the clinical staff has captured with the software.  The verification of the translation of this report to the ICD and CPT codes and modifiers is the sole responsibility of the health care institution and practicing physician where this report was generated.  [employer] will not be held responsible for the validity of the ICD and CPT codes included on this report.  AMA assumes no liability for data contained or not contained herein. CPT is a Designer, television/film set of the [employer].  PATIENT NAME:  [name], [name] MR#: [id number]

## 2014-08-24 NOTE — H&P (Signed)
  Date of Initial H&P: 08/24/14  History reviewed, patient examined, no change in status, stable for surgery. EGD with Bravo capsule to evaluate for acid reflux.

## 2014-08-24 NOTE — Addendum Note (Signed)
Addended by: Wilford Corner on: 08/24/2014 08:48 AM   Modules accepted: Orders

## 2014-08-25 ENCOUNTER — Encounter (HOSPITAL_COMMUNITY): Payer: Self-pay | Admitting: Gastroenterology

## 2014-11-23 ENCOUNTER — Ambulatory Visit (INDEPENDENT_AMBULATORY_CARE_PROVIDER_SITE_OTHER): Payer: BC Managed Care – PPO | Admitting: Family Medicine

## 2014-11-23 ENCOUNTER — Ambulatory Visit (HOSPITAL_BASED_OUTPATIENT_CLINIC_OR_DEPARTMENT_OTHER)
Admission: RE | Admit: 2014-11-23 | Discharge: 2014-11-23 | Disposition: A | Discharge status: Home / Self Care | Payer: BC Managed Care – PPO | Source: Ambulatory Visit | Attending: Family Medicine | Admitting: Family Medicine

## 2014-11-23 ENCOUNTER — Encounter: Payer: Self-pay | Admitting: Family Medicine

## 2014-11-23 VITALS — BP 129/85 | HR 76 | Ht 63.0 in | Wt 118.0 lb

## 2014-11-23 DIAGNOSIS — M545 Low back pain: Secondary | ICD-10-CM | POA: Insufficient documentation

## 2014-11-23 DIAGNOSIS — S29019A Strain of muscle and tendon of unspecified wall of thorax, initial encounter: Secondary | ICD-10-CM

## 2014-11-23 DIAGNOSIS — S29009A Unspecified injury of muscle and tendon of unspecified wall of thorax, initial encounter: Secondary | ICD-10-CM

## 2014-11-23 DIAGNOSIS — M546 Pain in thoracic spine: Secondary | ICD-10-CM

## 2014-11-23 NOTE — Patient Instructions (Signed)
You have a thoracic strain. Start physical therapy and do home exercises on days you don't go to therapy. You can consider going more than a couple visits if you feel the modalities (ultrasound, e-stim) are providing you with relief. Ibuprofen 600mg  three times a day with food OR aleve 2 tabs twice a day with food for pain and inflammation. Heat as needed for spasms. Typically activities as tolerated - want pain level to be less than a 3 on a scale of 1-10 with those activities. Follow up with me in 5-6 weeks.

## 2014-11-25 DIAGNOSIS — S29019A Strain of muscle and tendon of unspecified wall of thorax, initial encounter: Secondary | ICD-10-CM | POA: Insufficient documentation

## 2014-11-25 NOTE — Progress Notes (Signed)
PCP: Lilian Coma, MD  Subjective:   HPI: Patient is a 64 y.o. female here for mid-back pain.  Patient reports on 1/11 she had a little back pain following some 1 on 1 sessions with her trainer doing more upper body, back workouts. Worsened that evening and by 1/12 couldn't get out of bed. Pain primarily in thoracic area on right side. No radiation from here. Muscle relaxants and pain medication helped slightly. Has had 3 massages, doing stretching on her own. Given a steroid shot for her cough but didn't notice much change with this. Does have osteoporosis.  Past Medical History  Diagnosis Date  . Elevated cholesterol   . Bladder tumor     HEMATURIA - UROLOGICAL WORK UP - FOUND TO HAVE BLADDER TUMORS AND KIDNEY STONE  . Headache(784.0)     MIGRAINES  . Arthritis     LUMBAR SPINE / BACK PAIN  . Cancer     bladder cancer    Current Outpatient Prescriptions on File Prior to Visit  Medication Sig Dispense Refill  . atorvastatin (LIPITOR) 10 MG tablet Take 10 mg by mouth every other day.    . beta carotene w/minerals (OCUVITE) tablet Take 1 tablet by mouth daily.    . Calcium Polycarbophil (FIBER LAXATIVE PO) Take 1 tablet by mouth daily.    . calcium-vitamin D (OSCAL WITH D) 250-125 MG-UNIT per tablet Take 1 tablet by mouth daily.    Pershing Cox 200 UNIT/ACT nasal spray Place 1 spray into alternate nostrils daily.     . Multiple Vitamins-Minerals (MULTIVITAMIN WITH MINERALS) tablet Take 1 tablet by mouth daily.    Marland Kitchen oxybutynin (DITROPAN-XL) 5 MG 24 hr tablet Take 5 mg by mouth daily.    . vitamin C (ASCORBIC ACID) 500 MG tablet Take 500 mg by mouth daily.    Marland Kitchen zolpidem (AMBIEN) 10 MG tablet Take 5 mg by mouth at bedtime.      No current facility-administered medications on file prior to visit.    Past Surgical History  Procedure Laterality Date  . C-sections      X 1  . Orif left elbow    . 2nd left elbow surgery to remove bone chips    . Left shoulder surgery to  repair torn ligament    . Orif left foot    . Colon surgery      SIGMOID COLECTOMY  FOR DIVERTICULITS  . Blepharoplasty - bilateral    . Dilation and curettage of uterus    . Transurethral resection of bladder tumor with gyrus (turbt-gyrus) N/A 06/19/2013    Procedure: TRANSURETHRAL RESECTION OF BLADDER TUMOR WITH GYRUS (TURBT-GYRUS);  Surgeon: Molli Hazard, MD;  Location: [facility];  Service: Urology;  Laterality: N/A;  . Cystoscopy w/ retrogrades Bilateral 06/19/2013    Procedure: CYSTOSCOPY WITH RETROGRADE PYELOGRAM;  Surgeon: Molli Hazard, MD;  Location: [facility];  Service: Urology;  Laterality: Bilateral;  . Esophagogastroduodenoscopy N/A 08/24/2014    Procedure: ESOPHAGOGASTRODUODENOSCOPY (EGD);  Surgeon: Lear Ng, MD;  Location: [facility];  Service: Endoscopy;  Laterality: N/A;  . Bravo ph study N/A 08/24/2014    Procedure: BRAVO [place];  Surgeon: Lear Ng, MD;  Location: [facility];  Service: Endoscopy;  Laterality: N/A;    Allergies  Allergen Reactions  . Hydrocodone-Acetaminophen     REACTION: nightmares  . Moxifloxacin     REACTION: rash, itching  . Pseudoephedrine     REACTION: makes heart race  . Sulfonamide Derivatives  REACTION: hives    History   Social History  . Marital Status: Married    Spouse Name: N/A    Number of Children: N/A  . Years of Education: N/A   Occupational History  . Not on file.   Social History Main Topics  . Smoking status: Never Smoker   . Smokeless tobacco: Never Used  . Alcohol Use: No  . Drug Use: No  . Sexual Activity: Not on file   Other Topics Concern  . Not on file   Social History Narrative    Family History  Problem Relation Age of Onset  . Hypertension Brother     BP 129/85 mmHg  Pulse 76  Ht 5\' 3"  (1.6 m)  Wt 118 lb (53.524 kg)  BMI 20.91 kg/m2  Review of Systems: See HPI above.    Objective:  Physical Exam:  Gen: NAD  Back: No gross deformity,  scoliosis. TTP right thoracic paraspinal region.  No midline or bony TTP or pain to percussion. FROM with pain on trunk rotation. Strength all extremities 5/5 all muscle groups. Negative SLRs. Sensation intact to light touch bilaterally.    Assessment & Plan:  1. Thoracic strain - radiographs negative for compression fracture.  Patient reassured.  Start physical therapy and home exercises for next 6 weeks.  NSAIDs for pain and inflammation, heat for spasms.  Activities as tolerated.  F/u in 5-6 weeks.

## 2014-11-25 NOTE — Assessment & Plan Note (Signed)
radiographs negative for compression fracture.  Patient reassured.  Start physical therapy and home exercises for next 6 weeks.  NSAIDs for pain and inflammation, heat for spasms.  Activities as tolerated.  F/u in 5-6 weeks.

## 2014-12-31 ENCOUNTER — Ambulatory Visit: Payer: BC Managed Care – PPO | Admitting: Family Medicine

## 2015-03-10 ENCOUNTER — Other Ambulatory Visit: Payer: Self-pay | Admitting: Family Medicine

## 2015-03-10 ENCOUNTER — Other Ambulatory Visit: Payer: BC Managed Care – PPO

## 2015-03-10 ENCOUNTER — Encounter (HOSPITAL_COMMUNITY): Payer: Self-pay | Admitting: Emergency Medicine

## 2015-03-10 ENCOUNTER — Emergency Department (HOSPITAL_COMMUNITY)
Admission: EM | Admit: 2015-03-10 | Discharge: 2015-03-10 | Disposition: A | Discharge status: Home / Self Care | Payer: BC Managed Care – PPO | Attending: Emergency Medicine | Admitting: Emergency Medicine

## 2015-03-10 ENCOUNTER — Emergency Department (HOSPITAL_COMMUNITY): Payer: BC Managed Care – PPO

## 2015-03-10 DIAGNOSIS — R109 Unspecified abdominal pain: Secondary | ICD-10-CM

## 2015-03-10 DIAGNOSIS — G43909 Migraine, unspecified, not intractable, without status migrainosus: Secondary | ICD-10-CM | POA: Diagnosis not present

## 2015-03-10 DIAGNOSIS — E780 Pure hypercholesterolemia: Secondary | ICD-10-CM | POA: Diagnosis not present

## 2015-03-10 DIAGNOSIS — Z79899 Other long term (current) drug therapy: Secondary | ICD-10-CM | POA: Diagnosis not present

## 2015-03-10 DIAGNOSIS — Z8739 Personal history of other diseases of the musculoskeletal system and connective tissue: Secondary | ICD-10-CM | POA: Diagnosis not present

## 2015-03-10 DIAGNOSIS — Z87448 Personal history of other diseases of urinary system: Secondary | ICD-10-CM | POA: Diagnosis not present

## 2015-03-10 DIAGNOSIS — N200 Calculus of kidney: Secondary | ICD-10-CM | POA: Insufficient documentation

## 2015-03-10 DIAGNOSIS — Z8551 Personal history of malignant neoplasm of bladder: Secondary | ICD-10-CM | POA: Insufficient documentation

## 2015-03-10 DIAGNOSIS — M549 Dorsalgia, unspecified: Secondary | ICD-10-CM | POA: Diagnosis present

## 2015-03-10 LAB — BASIC METABOLIC PANEL
Anion gap: 7 (ref 5–15)
BUN: 25 mg/dL — AB (ref 6–20)
CALCIUM: 9.9 mg/dL (ref 8.9–10.3)
CO2: 25 mmol/L (ref 22–32)
Chloride: 106 mmol/L (ref 101–111)
Creatinine, Ser: 0.56 mg/dL (ref 0.44–1.00)
GFR calc Af Amer: >60 mL/min (ref >60)
GFR calc non Af Amer: >60 mL/min (ref >60)
GLUCOSE: 109 mg/dL — AB (ref 70–99)
Potassium: 4.1 mmol/L (ref 3.5–5.1)
SODIUM: 138 mmol/L (ref 135–145)

## 2015-03-10 LAB — CBC
HCT: 42.5 % (ref 36.0–46.0)
Hemoglobin: 14.2 g/dL (ref 12.0–15.0)
MCH: 31.1 pg (ref 26.0–34.0)
MCHC: 33.4 g/dL (ref 30.0–36.0)
MCV: 93 fL (ref 78.0–100.0)
Platelets: 215 10*3/uL (ref 150–400)
RBC: 4.57 MIL/uL (ref 3.87–5.11)
RDW: 12.6 % (ref 11.5–15.5)
WBC: 5.1 10*3/uL (ref 4.0–10.5)

## 2015-03-10 LAB — URINALYSIS, ROUTINE W REFLEX MICROSCOPIC
Bilirubin Urine: NEGATIVE
Glucose, UA: NEGATIVE mg/dL
Ketones, ur: NEGATIVE mg/dL
Leukocytes, UA: NEGATIVE
Nitrite: NEGATIVE
Protein, ur: NEGATIVE mg/dL
Specific Gravity, Urine: 1.02 (ref 1.005–1.030)
Urobilinogen, UA: 0.2 mg/dL (ref 0.0–1.0)
pH: 5.5 (ref 5.0–8.0)

## 2015-03-10 LAB — URINE MICROSCOPIC-ADD ON

## 2015-03-10 MED ORDER — OXYCODONE-ACETAMINOPHEN 5-325 MG PO TABS: 2.0000 | ORAL_TABLET | Freq: Four times a day (QID) | ORAL | Status: DC | PRN

## 2015-03-10 MED ORDER — ONDANSETRON HCL 4 MG/2ML IJ SOLN
4.0000 mg | Freq: Once | INTRAMUSCULAR | Status: AC
  Administered 2015-03-10: 4 mg via INTRAVENOUS
  Filled 2015-03-10: qty 2

## 2015-03-10 MED ORDER — ONDANSETRON HCL 4 MG PO TABS: 4.0000 mg | ORAL_TABLET | Freq: Four times a day (QID) | ORAL | Status: DC

## 2015-03-10 MED ORDER — MORPHINE SULFATE 4 MG/ML IJ SOLN
4.0000 mg | Freq: Once | INTRAMUSCULAR | Status: AC
  Administered 2015-03-10: 4 mg via INTRAVENOUS
  Filled 2015-03-10: qty 1

## 2015-03-10 NOTE — ED Notes (Signed)
Per pt, states lower back pain since this am-no dysuria, hematuria per PCP

## 2015-03-10 NOTE — ED Provider Notes (Signed)
CSN: [id number]     Arrival date & time 03/10/15  1233 History   First MD Initiated Contact with Patient 03/10/15 1321     Chief Complaint  Patient presents with  . Back Pain     (Consider location/radiation/quality/duration/timing/severity/associated sxs/prior Treatment) HPI Comments: Patient presents to the emergency department with chief complaint of left flank pain. She states that the pain started this morning. She reports that it was severe. States that it is now a 7 out of 10. States that she has a history of kidney stones. She was seen by her primary care provider, and found to have blood in her urine. She was referred to the emergency department for CT scan. She has not taken anything for her pain. She denies any fevers or chills. Additionally, she states that she had a colonoscopy 2 days ago. However, she denies any abdominal pain. Denies any nausea, vomiting, diarrhea, or constipation. There are no aggravating or alleviating factors.  The history is provided by the patient. No language interpreter was used.    Past Medical History  Diagnosis Date  . Elevated cholesterol   . Bladder tumor     HEMATURIA - UROLOGICAL WORK UP - FOUND TO HAVE BLADDER TUMORS AND KIDNEY STONE  . Headache(784.0)     MIGRAINES  . Arthritis     LUMBAR SPINE / BACK PAIN  . Cancer     bladder cancer  . Renal disorder    Past Surgical History  Procedure Laterality Date  . C-sections      X 1  . Orif left elbow    . 2nd left elbow surgery to remove bone chips    . Left shoulder surgery to repair torn ligament    . Orif left foot    . Colon surgery      SIGMOID COLECTOMY  FOR DIVERTICULITS  . Blepharoplasty - bilateral    . Dilation and curettage of uterus    . Transurethral resection of bladder tumor with gyrus (turbt-gyrus) N/A 06/19/2013    Procedure: TRANSURETHRAL RESECTION OF BLADDER TUMOR WITH GYRUS (TURBT-GYRUS);  Surgeon: Molli Hazard, MD;  Location: [facility];  Service: Urology;   Laterality: N/A;  . Cystoscopy w/ retrogrades Bilateral 06/19/2013    Procedure: CYSTOSCOPY WITH RETROGRADE PYELOGRAM;  Surgeon: Molli Hazard, MD;  Location: [facility];  Service: Urology;  Laterality: Bilateral;  . Esophagogastroduodenoscopy N/A 08/24/2014    Procedure: ESOPHAGOGASTRODUODENOSCOPY (EGD);  Surgeon: Lear Ng, MD;  Location: [facility];  Service: Endoscopy;  Laterality: N/A;  . Bravo ph study N/A 08/24/2014    Procedure: BRAVO [place];  Surgeon: Lear Ng, MD;  Location: [facility];  Service: Endoscopy;  Laterality: N/A;   Family History  Problem Relation Age of Onset  . Hypertension Brother    History  Substance Use Topics  . Smoking status: Never Smoker   . Smokeless tobacco: Never Used  . Alcohol Use: No   OB History    No data available     Review of Systems  Constitutional: Negative for fever and chills.  Respiratory: Negative for shortness of breath.   Cardiovascular: Negative for chest pain.  Gastrointestinal: Negative for nausea, vomiting, diarrhea and constipation.  Genitourinary: Positive for hematuria and flank pain. Negative for dysuria.  All other systems reviewed and are negative.     Allergies  Hydrocodone-acetaminophen; Moxifloxacin; Pseudoephedrine; and Sulfonamide derivatives  Home Medications   Prior to Admission medications   Medication Sig Start Date End Date Taking? Authorizing  Provider  atorvastatin (LIPITOR) 10 MG tablet Take 10 mg by mouth every other day.   Yes Historical Provider, MD  beta carotene w/minerals (OCUVITE) tablet Take 1 tablet by mouth daily.   Yes Historical Provider, MD  butalbital-aspirin-caffeine [facility]) 50-325-40 MG per capsule Take 1 capsule by mouth every 4 (four) hours as needed. 01/20/15  Yes Historical Provider, MD  calcium-vitamin D (OSCAL WITH D) 250-125 MG-UNIT per tablet Take 1 tablet by mouth daily.   Yes Historical Provider, MD  FORTICAL 200 UNIT/ACT nasal spray Place 1  spray into alternate nostrils daily.  11/20/13  Yes Historical Provider, MD  hyoscyamine (LEVBID) 0.375 MG 12 hr tablet Take 0.375 mg by mouth every 12 (twelve) hours as needed. 03/04/15 03/03/16 Yes Historical Provider, MD  Multiple Vitamins-Minerals (MULTIVITAMIN WITH MINERALS) tablet Take 1 tablet by mouth daily.   Yes Historical Provider, MD  olopatadine (PATANOL) 0.1 % ophthalmic solution Place 1 drop into both eyes 2 (two) times daily. 03/04/15  Yes Historical Provider, MD  vitamin C (ASCORBIC ACID) 500 MG tablet Take 500 mg by mouth daily.   Yes Historical Provider, MD  zolpidem (AMBIEN) 10 MG tablet Take 5 mg by mouth at bedtime.  11/24/13  Yes Historical Provider, MD   BP 152/82 mmHg  Pulse 58  Temp(Src) 97.8 F (36.6 C) (Oral)  Resp 18  SpO2 100% Physical Exam  Constitutional: She is oriented to person, place, and time. She appears well-developed and well-nourished.  HENT:  Head: Normocephalic and atraumatic.  Eyes: Conjunctivae and EOM are normal. Pupils are equal, round, and reactive to light.  Neck: Normal range of motion. Neck supple.  Cardiovascular: Normal rate and regular rhythm.  Exam reveals no gallop and no friction rub.   No murmur heard. Pulmonary/Chest: Effort normal and breath sounds normal. No respiratory distress. She has no wheezes. She has no rales. She exhibits no tenderness.  Clear to auscultation bilaterally  Abdominal: Soft. Bowel sounds are normal. She exhibits no distension and no mass. There is no tenderness. There is no rebound and no guarding.  No focal abdominal tenderness, no RLQ tenderness or pain at McBurney's point, no RUQ tenderness or Murphy's sign, no left-sided abdominal tenderness, no fluid wave, or signs of peritonitis   Musculoskeletal: Normal range of motion. She exhibits no edema or tenderness.  Neurological: She is alert and oriented to person, place, and time.  Skin: Skin is warm and dry.  Psychiatric: She has a normal mood and affect. Her  behavior is normal. Judgment and thought content normal.  Nursing note and vitals reviewed.   ED Course  Procedures (including critical care time) Labs Review Labs Reviewed  BASIC METABOLIC PANEL - Abnormal; Notable for the following:    Glucose, Bld 109 (*)    BUN 25 (*)    All other components within normal limits  URINALYSIS, ROUTINE W REFLEX MICROSCOPIC - Abnormal; Notable for the following:    APPearance CLOUDY (*)    Hgb urine dipstick LARGE (*)    All other components within normal limits  CBC  URINE MICROSCOPIC-ADD ON    Imaging Review No results found.   EKG Interpretation None      MDM   Final diagnoses:  Flank pain  Kidney stone    Patient with left-sided flank pain. Possible kidney stone. However, given recent colonoscopy, check CT rule out free air or perforation. We'll treat pain with morphine. Patient discussed with Dr. Aline Brochure, who agrees with the plan.  3:04 PM Patient reassessed,  she is feeling improved with morphine.    CT scan remarkable for (2) 3-22mm kidney stones. Pain controlled in the ED. Urine unremarkable for an infection. Other labs are reassuring. Will discharge to home with pain medicine. Recommend urology follow-up. Patient understands and agrees with the plan. She is stable and ready for discharge.    Montine Circle, PA-C 03/10/15 [place], MD 03/11/15 [phone]

## 2015-03-10 NOTE — Discharge Instructions (Signed)

## 2015-04-06 ENCOUNTER — Other Ambulatory Visit: Payer: Self-pay | Admitting: Urology

## 2015-04-19 ENCOUNTER — Encounter (HOSPITAL_BASED_OUTPATIENT_CLINIC_OR_DEPARTMENT_OTHER): Payer: Self-pay | Admitting: *Deleted

## 2015-04-19 NOTE — Progress Notes (Signed)
NPO AFTER MN WITH EXCEPTION CLEAR LIQUIDS UNTIL 0800, NO CREAM/ MILK PRODUCTS).  ARRIVE AT 1230.  NEEDS ISTAT 8.  MAY TAKE PAIN/ NAUSEA RX IF NEEDED AM DOS W/ SIPS OF WATER.

## 2015-04-20 ENCOUNTER — Encounter (HOSPITAL_BASED_OUTPATIENT_CLINIC_OR_DEPARTMENT_OTHER): Payer: Self-pay | Admitting: *Deleted

## 2015-04-21 ENCOUNTER — Ambulatory Visit (HOSPITAL_BASED_OUTPATIENT_CLINIC_OR_DEPARTMENT_OTHER): Payer: BC Managed Care – PPO | Admitting: Anesthesiology

## 2015-04-21 ENCOUNTER — Encounter (HOSPITAL_BASED_OUTPATIENT_CLINIC_OR_DEPARTMENT_OTHER)
Admission: RE | Disposition: A | Discharge status: Home / Self Care | Payer: Self-pay | Source: Ambulatory Visit | Attending: Urology

## 2015-04-21 ENCOUNTER — Encounter (HOSPITAL_BASED_OUTPATIENT_CLINIC_OR_DEPARTMENT_OTHER): Payer: Self-pay | Admitting: *Deleted

## 2015-04-21 ENCOUNTER — Ambulatory Visit (HOSPITAL_BASED_OUTPATIENT_CLINIC_OR_DEPARTMENT_OTHER)
Admission: RE | Admit: 2015-04-21 | Discharge: 2015-04-21 | Disposition: A | Discharge status: Home / Self Care | Payer: BC Managed Care – PPO | Source: Ambulatory Visit | Attending: Urology | Admitting: Urology

## 2015-04-21 DIAGNOSIS — E785 Hyperlipidemia, unspecified: Secondary | ICD-10-CM | POA: Diagnosis not present

## 2015-04-21 DIAGNOSIS — C679 Malignant neoplasm of bladder, unspecified: Secondary | ICD-10-CM | POA: Insufficient documentation

## 2015-04-21 DIAGNOSIS — Z888 Allergy status to other drugs, medicaments and biological substances status: Secondary | ICD-10-CM | POA: Diagnosis not present

## 2015-04-21 DIAGNOSIS — N200 Calculus of kidney: Secondary | ICD-10-CM | POA: Diagnosis present

## 2015-04-21 DIAGNOSIS — K219 Gastro-esophageal reflux disease without esophagitis: Secondary | ICD-10-CM | POA: Diagnosis not present

## 2015-04-21 DIAGNOSIS — R0602 Shortness of breath: Secondary | ICD-10-CM | POA: Diagnosis not present

## 2015-04-21 DIAGNOSIS — Z885 Allergy status to narcotic agent status: Secondary | ICD-10-CM | POA: Insufficient documentation

## 2015-04-21 DIAGNOSIS — Z882 Allergy status to sulfonamides status: Secondary | ICD-10-CM | POA: Diagnosis not present

## 2015-04-21 DIAGNOSIS — R3915 Urgency of urination: Secondary | ICD-10-CM | POA: Insufficient documentation

## 2015-04-21 DIAGNOSIS — K449 Diaphragmatic hernia without obstruction or gangrene: Secondary | ICD-10-CM | POA: Diagnosis not present

## 2015-04-21 HISTORY — DX: Other seasonal allergic rhinitis: J30.2

## 2015-04-21 HISTORY — DX: Other specified postprocedural states: Z98.890

## 2015-04-21 HISTORY — DX: Other specified postprocedural states: R11.2

## 2015-04-21 HISTORY — PX: TRANSURETHRAL RESECTION OF BLADDER TUMOR WITH GYRUS (TURBT-GYRUS): SHX6458

## 2015-04-21 HISTORY — PX: CYSTOSCOPY WITH RETROGRADE PYELOGRAM, URETEROSCOPY AND STENT PLACEMENT: SHX5789

## 2015-04-21 HISTORY — DX: Hyperlipidemia, unspecified: E78.5

## 2015-04-21 HISTORY — DX: Personal history of other diseases of the digestive system: Z87.19

## 2015-04-21 LAB — POCT I-STAT, CHEM 8
BUN: 20 mg/dL (ref 6–20)
CREATININE: 0.6 mg/dL (ref 0.44–1.00)
Calcium, Ion: 1.27 mmol/L (ref 1.13–1.30)
Chloride: 106 mmol/L (ref 101–111)
Glucose, Bld: 97 mg/dL (ref 65–99)
HCT: 42 % (ref 36.0–46.0)
Hemoglobin: 14.3 g/dL (ref 12.0–15.0)
Potassium: 3.9 mmol/L (ref 3.5–5.1)
Sodium: 142 mmol/L (ref 135–145)
TCO2: 22 mmol/L (ref 0–100)

## 2015-04-21 SURGERY — TRANSURETHRAL RESECTION OF BLADDER TUMOR WITH GYRUS (TURBT-GYRUS)
Anesthesia: General | Site: Ureter | Laterality: Right

## 2015-04-21 MED ORDER — MIDAZOLAM HCL 5 MG/5ML IJ SOLN
INTRAMUSCULAR | Status: DC | PRN
  Administered 2015-04-21 (×2): 1 mg via INTRAVENOUS

## 2015-04-21 MED ORDER — MITOMYCIN CHEMO FOR BLADDER INSTILLATION 40 MG
INTRAVENOUS | Status: DC | PRN
  Administered 2015-04-21: 40 mg via INTRAVESICAL

## 2015-04-21 MED ORDER — GENTAMICIN IN SALINE 1.6-0.9 MG/ML-% IV SOLN
80.0000 mg | INTRAVENOUS | Status: DC
  Filled 2015-04-21: qty 50

## 2015-04-21 MED ORDER — PROPOFOL 10 MG/ML IV BOLUS
INTRAVENOUS | Status: DC | PRN
  Administered 2015-04-21: 150 mg via INTRAVENOUS

## 2015-04-21 MED ORDER — SODIUM CHLORIDE 0.9 % IR SOLN
Status: DC | PRN
  Administered 2015-04-21: 6000 mL

## 2015-04-21 MED ORDER — SENNOSIDES-DOCUSATE SODIUM 8.6-50 MG PO TABS: 1.0000 | ORAL_TABLET | Freq: Two times a day (BID) | ORAL | Status: DC

## 2015-04-21 MED ORDER — LIDOCAINE HCL (CARDIAC) 20 MG/ML IV SOLN
INTRAVENOUS | Status: DC | PRN
  Administered 2015-04-21: 50 mg via INTRAVENOUS

## 2015-04-21 MED ORDER — DEXAMETHASONE SODIUM PHOSPHATE 4 MG/ML IJ SOLN
INTRAMUSCULAR | Status: DC | PRN
  Administered 2015-04-21: 10 mg via INTRAVENOUS

## 2015-04-21 MED ORDER — PROMETHAZINE HCL 25 MG/ML IJ SOLN
INTRAMUSCULAR | Status: AC
  Filled 2015-04-21: qty 1

## 2015-04-21 MED ORDER — FENTANYL CITRATE (PF) 100 MCG/2ML IJ SOLN
INTRAMUSCULAR | Status: AC
  Filled 2015-04-21: qty 6

## 2015-04-21 MED ORDER — ONDANSETRON HCL 4 MG/2ML IJ SOLN
INTRAMUSCULAR | Status: DC | PRN
  Administered 2015-04-21: 4 mg via INTRAVENOUS

## 2015-04-21 MED ORDER — MIDAZOLAM HCL 2 MG/2ML IJ SOLN
INTRAMUSCULAR | Status: AC
  Filled 2015-04-21: qty 2

## 2015-04-21 MED ORDER — OXYCODONE-ACETAMINOPHEN 5-325 MG PO TABS: 1.0000 | ORAL_TABLET | Freq: Four times a day (QID) | ORAL | Status: DC | PRN

## 2015-04-21 MED ORDER — HYOSCYAMINE SULFATE ER 0.375 MG PO TB12
0.3750 mg | ORAL_TABLET | Freq: Two times a day (BID) | ORAL | Status: DC
  Administered 2015-04-21: 0.375 mg via ORAL
  Filled 2015-04-21: qty 1

## 2015-04-21 MED ORDER — PROMETHAZINE HCL 25 MG/ML IJ SOLN
6.2500 mg | INTRAMUSCULAR | Status: DC | PRN
  Administered 2015-04-21: 6.25 mg via INTRAVENOUS
  Filled 2015-04-21: qty 1

## 2015-04-21 MED ORDER — LACTATED RINGERS IV SOLN
INTRAVENOUS | Status: DC
Start: 2015-04-21 — End: 2015-04-21
  Administered 2015-04-21 (×2): via INTRAVENOUS
  Filled 2015-04-21: qty 1000

## 2015-04-21 MED ORDER — FENTANYL CITRATE (PF) 100 MCG/2ML IJ SOLN
INTRAMUSCULAR | Status: AC
Start: 2015-04-21 — End: 2015-04-21
  Filled 2015-04-21: qty 2

## 2015-04-21 MED ORDER — FENTANYL CITRATE (PF) 100 MCG/2ML IJ SOLN
INTRAMUSCULAR | Status: AC
  Filled 2015-04-21: qty 2

## 2015-04-21 MED ORDER — MITOMYCIN CHEMO FOR BLADDER INSTILLATION 40 MG
40.0000 mg | Freq: Once | INTRAVENOUS | Status: DC
  Filled 2015-04-21: qty 40

## 2015-04-21 MED ORDER — KETOROLAC TROMETHAMINE 30 MG/ML IJ SOLN
INTRAMUSCULAR | Status: DC | PRN
  Administered 2015-04-21: 30 mg via INTRAVENOUS

## 2015-04-21 MED ORDER — IOHEXOL 350 MG/ML SOLN
INTRAVENOUS | Status: DC | PRN
  Administered 2015-04-21: 8 mL

## 2015-04-21 MED ORDER — FENTANYL CITRATE (PF) 100 MCG/2ML IJ SOLN
INTRAMUSCULAR | Status: DC | PRN
  Administered 2015-04-21 (×2): 25 ug via INTRAVENOUS

## 2015-04-21 MED ORDER — GENTAMICIN SULFATE 40 MG/ML IJ SOLN
5.0000 mg/kg | INTRAVENOUS | Status: AC
  Administered 2015-04-21: 260 mg via INTRAVENOUS
  Filled 2015-04-21: qty 6.5

## 2015-04-21 MED ORDER — FENTANYL CITRATE (PF) 100 MCG/2ML IJ SOLN
25.0000 ug | INTRAMUSCULAR | Status: DC | PRN
  Administered 2015-04-21: 50 ug via INTRAVENOUS
  Administered 2015-04-21 (×2): 25 ug via INTRAVENOUS
  Filled 2015-04-21: qty 1

## 2015-04-21 SURGICAL SUPPLY — 46 items
BAG DRAIN URO-CYSTO SKYTR STRL (DRAIN) ×4 IMPLANT
BAG URINE DRAINAGE (UROLOGICAL SUPPLIES) IMPLANT
BAG URINE LEG 19OZ MD ST LTX (BAG) IMPLANT
BAG URINE LEG 500ML (DRAIN) IMPLANT
BAG URO CATCHER STRL LF (DRAPE) ×4 IMPLANT
BASKET LASER NITINOL 1.9FR (BASKET) ×4 IMPLANT
BASKET STNLS GEMINI 4WIRE 3FR (BASKET) IMPLANT
BASKET ZERO TIP NITINOL 2.4FR (BASKET) IMPLANT
CANISTER SUCT LVC 12 LTR MEDI- (MISCELLANEOUS) IMPLANT
CATH FOLEY 2WAY SLVR  5CC 22FR (CATHETERS)
CATH FOLEY 2WAY SLVR 30CC 20FR (CATHETERS) IMPLANT
CATH FOLEY 2WAY SLVR 5CC 22FR (CATHETERS) IMPLANT
CATH INTERMIT  6FR 70CM (CATHETERS) ×4 IMPLANT
CATH URET 5FR 28IN CONE TIP (BALLOONS)
CATH URET 5FR 28IN OPEN ENDED (CATHETERS) IMPLANT
CATH URET 5FR 70CM CONE TIP (BALLOONS) IMPLANT
CLOTH BEACON ORANGE TIMEOUT ST (SAFETY) ×4 IMPLANT
ELECT BIVAP BIPO 22/24 DONUT (ELECTROSURGICAL)
ELECT REM PT RETURN 9FT ADLT (ELECTROSURGICAL) ×4
ELECTRD BIVAP BIPO 22/24 DONUT (ELECTROSURGICAL) IMPLANT
ELECTRODE REM PT RTRN 9FT ADLT (ELECTROSURGICAL) ×3 IMPLANT
EVACUATOR MICROVAS BLADDER (UROLOGICAL SUPPLIES) IMPLANT
FIBER LASER FLEXIVA 365 (UROLOGICAL SUPPLIES) IMPLANT
FIBER LASER TRAC TIP (UROLOGICAL SUPPLIES) IMPLANT
GLOVE BIO SURGEON STRL SZ7.5 (GLOVE) ×4 IMPLANT
GOWN STRL REUS W/ TWL LRG LVL3 (GOWN DISPOSABLE) ×6 IMPLANT
GOWN STRL REUS W/ TWL XL LVL3 (GOWN DISPOSABLE) ×3 IMPLANT
GOWN STRL REUS W/TWL LRG LVL3 (GOWN DISPOSABLE) ×2
GOWN STRL REUS W/TWL XL LVL3 (GOWN DISPOSABLE) ×1
GUIDEWIRE 0.038 PTFE COATED (WIRE) IMPLANT
GUIDEWIRE ANG ZIPWIRE 038X150 (WIRE) ×4 IMPLANT
GUIDEWIRE STR DUAL SENSOR (WIRE) ×4 IMPLANT
IV NS IRRIG 3000ML ARTHROMATIC (IV SOLUTION) ×8 IMPLANT
KIT BALLIN UROMAX 15FX10 (LABEL) IMPLANT
KIT BALLN UROMAX 15FX4 (MISCELLANEOUS) IMPLANT
KIT BALLN UROMAX 26 75X4 (MISCELLANEOUS)
LOOP CUT BIPOLAR 24F LRG (ELECTROSURGICAL) ×4 IMPLANT
MANIFOLD NEPTUNE II (INSTRUMENTS) ×4 IMPLANT
PACK CYSTO (CUSTOM PROCEDURE TRAY) ×4 IMPLANT
SET ASPIRATION TUBING (TUBING) IMPLANT
SET HIGH PRES BAL DIL (LABEL)
SHEATH ACCESS URETERAL 24CM (SHEATH) ×4 IMPLANT
STENT POLARIS 5FRX24 (STENTS) ×4 IMPLANT
SYRINGE 10CC LL (SYRINGE) ×4 IMPLANT
SYRINGE IRR TOOMEY STRL 70CC (SYRINGE) IMPLANT
TUBE FEEDING 8FR 16IN STR KANG (MISCELLANEOUS) ×4 IMPLANT

## 2015-04-21 NOTE — Anesthesia Preprocedure Evaluation (Addendum)
Anesthesia Evaluation  Patient identified by MRN, date of birth, ID band Patient awake    Reviewed: Allergy & Precautions, NPO status , Patient's Chart, lab work & pertinent test results  History of Anesthesia Complications (+) PONV and history of anesthetic complications  Airway Mallampati: II  TM Distance: >3 FB Neck ROM: Full    Dental no notable dental hx.    Pulmonary shortness of breath,  breath sounds clear to auscultation  Pulmonary exam normal       Cardiovascular Exercise Tolerance: Good negative cardio ROS Normal cardiovascular examRhythm:Regular Rate:Normal  ECG : SB, LAFB,  Q waves V1 and V2   Neuro/Psych  Headaches,  Neuromuscular disease negative psych ROS   GI/Hepatic Neg liver ROS, hiatal hernia, GERD-  ,  Endo/Other  negative endocrine ROS  Renal/GU Renal disease  negative genitourinary   Musculoskeletal  (+) Arthritis -,   Abdominal   Peds negative pediatric ROS (+)  Hematology negative hematology ROS (+)   Anesthesia Other Findings   Reproductive/Obstetrics negative OB ROS                            Anesthesia Physical Anesthesia Plan  ASA: II  Anesthesia Plan: General   Post-op Pain Management:    Induction: Intravenous  Airway Management Planned: LMA  Additional Equipment:   Intra-op Plan:   Post-operative Plan: Extubation in OR  Informed Consent: I have reviewed the patients History and Physical, chart, labs and discussed the procedure including the risks, benefits and alternatives for the proposed anesthesia with the patient or authorized representative who has indicated his/her understanding and acceptance.   Dental advisory given  Plan Discussed with: CRNA  Anesthesia Plan Comments:         Anesthesia Quick Evaluation

## 2015-04-21 NOTE — Anesthesia Procedure Notes (Signed)
Procedure Name: LMA Insertion Date/Time: 04/21/2015 1:55 PM Performed by: Mechele Claude Pre-anesthesia Checklist: Patient identified, Emergency Drugs available, Suction available and Patient being monitored Patient Re-evaluated:Patient Re-evaluated prior to inductionOxygen Delivery Method: Circle System Utilized Preoxygenation: Pre-oxygenation with 100% oxygen Intubation Type: IV induction Ventilation: Mask ventilation without difficulty LMA: LMA inserted LMA Size: 4.0 Number of attempts: 1 Airway Equipment and Method: bite block Placement Confirmation: positive ETCO2 Tube secured with: Tape Dental Injury: Teeth and Oropharynx as per pre-operative assessment

## 2015-04-21 NOTE — Transfer of Care (Signed)
Immediate Anesthesia Transfer of Care Note  Patient: [name]  Procedure(s) Performed: Procedure(s) (LRB): TRANSURETHRAL RESECTION OF BLADDER TUMOR WITH GYRUS (TURBT-GYRUS) (N/A) CYSTOSCOPY WITH RIGHT  RETROGRADE PYELOGRAM, RIGHT DIAGNOSTIC  URETEROSCOPY AND STENT PLACEMENT (Right)  Patient Location: PACU  Anesthesia Type: General  Level of Consciousness: awake, oriented, sedated and patient cooperative  Airway & Oxygen Therapy: Patient Spontanous Breathing and Patient connected to face mask oxygen  Post-op Assessment: Report given to PACU RN and Post -op Vital signs reviewed and stable  Post vital signs: Reviewed and stable  Complications: No apparent anesthesia complications

## 2015-04-21 NOTE — Discharge Instructions (Signed)
1 - You may have urinary urgency (bladder spasms) and bloody urine on / off with stent in place. This is normal. ° °2 - Call MD or go to ER for fever >102, severe pain / nausea / vomiting not relieved by medications, or acute change in medical status °[facility] °[phone] °Post Ureteroscopy With or Without Stent Instructions ° °Definitions: ° °Ureter: The duct that transports urine from the kidney to the bladder. °Stent:   A plastic hollow tube that is placed into the ureter, from the kidney to the                 bladder to prevent the ureter from swelling shut. ° °GENERAL INSTRUCTIONS: ° °Despite the fact that no skin incisions were used, the area around the ureter and bladder is raw and irritated. The stent is a foreign body which will further irritate the bladder wall. This irritation is manifested by increased frequency of urination, both day and night, and by an increase in the urge to urinate. In some, the urge to urinate is present almost always. Sometimes the urge is strong enough that you may not be able to stop yourself from urinating. The only real cure is to remove the stent and then give time for the bladder wall to heal which can't be done until the danger of the ureter swelling shut has passed, which varies. ° °You may see some blood in your urine while the stent is in place and a few days afterwards. Do not be alarmed, even if the urine was clear for a while. Get off your feet and drink lots of fluids until clearing occurs. If you start to pass clots or don't improve, call us. ° °DIET: °You may return to your normal diet immediately. Because of the raw surface of your bladder, alcohol, spicy foods, acid type foods and drinks with caffeine may cause irritation or frequency and should be used in moderation. To keep your urine flowing freely and to avoid constipation, drink plenty of fluids during the day ( 8-10 glasses ). °Tip: Avoid cranberry juice because it is very  acidic. ° °ACTIVITY: °Your physical activity doesn't need to be restricted. However, if you are very active, you may see some blood in your urine. We suggest that you reduce your activity under these circumstances until the bleeding has stopped. ° °BOWELS: °It is important to keep your bowels regular during the postoperative period. Straining with bowel movements can cause bleeding. A bowel movement every other day is reasonable. Use a mild laxative if needed, such as Milk of Magnesia 2-3 tablespoons, or 2 Dulcolax tablets. Call if you continue to have problems. If you have been taking narcotics for pain, before, during or after your surgery, you may be constipated. Take a laxative if necessary. ° ° °MEDICATION: °You should resume your pre-surgery medications unless told not to. In addition you will often be given an antibiotic to prevent infection. These should be taken as prescribed until the bottles are finished unless you are having an unusual reaction to one of the drugs. ° °PROBLEMS YOU SHOULD REPORT TO US: °· Fevers over 100.5 Fahrenheit. °· Heavy bleeding, or clots ( See above notes about blood in urine ). °· Inability to urinate. °· Drug reactions ( hives, rash, nausea, vomiting, diarrhea ). °· Severe burning or pain with urination that is not improving. ° °FOLLOW-UP: °You will need a follow-up appointment to monitor your progress. Call for this appointment at the number listed above.   Usually the first appointment will be about three to fourteen days after your surgery.                                                Transurethral Procedure  Medications: Resume all your other meds from home  Activity: 1. No heavy lifting > 10 pounds for 2 weeks. 2. No sexual activity for 2 weeks. 3. No strenuous activity for 2 weeks. 4. No driving while on narcotic pain medications. 5. Drink plenty of water. 6. Continue to walk at home - you can still get blood clots when you are at home so keep     active  but don't over do it. 7. Your urine may have some blood in it - make sure you drink plenty of water. Call or           come to the ER immediately if you catheter stops draining or you are unable to urinate.  Bathing: You can shower. You can take a bath unless you have a foley catheter in place.  Signs / Symptoms to call: 1. Call if you have a fever greater than 101.5  2. Uncontrolled nausea / vomiting, uncontrolled pain / dizziness, unable to urinate, leg         swelling / leg pain, or any other concerns.   You can reach [place] at [place] Instructions  Activity: Get plenty of rest for the remainder of the day. A responsible adult should stay with you for 24 hours following the procedure.  For the next 24 hours, DO NOT: -Drive a car -Paediatric nurse -Drink alcoholic beverages -Take any medication unless instructed by your physician -Make any legal decisions or sign important papers.  Meals: Start with liquid foods such as gelatin or soup. Progress to regular foods as tolerated. Avoid greasy, spicy, heavy foods. If nausea and/or vomiting occur, drink only clear liquids until the nausea and/or vomiting subsides. Call your physician if vomiting continues.  Special Instructions/Symptoms: Your throat may feel dry or sore from the anesthesia or the breathing tube placed in your throat during surgery. If this causes discomfort, gargle with warm salt water. The discomfort should disappear within 24 hours.  If you had a scopolamine patch placed behind your ear for the management of post- operative nausea and/or vomiting:  1. The medication in the patch is effective for 72 hours, after which it should be removed.  Wrap patch in a tissue and discard in the trash. Wash hands thoroughly with soap and water. 2. You may remove the patch earlier than 72 hours if you experience unpleasant side effects which may include dry mouth, dizziness or visual disturbances. 3.  Avoid touching the patch. Wash your hands with soap and water after contact with the patch.

## 2015-04-21 NOTE — Brief Op Note (Signed)
04/21/2015  2:33 PM  PATIENT:  [name]  64 y.o. female  PRE-OPERATIVE DIAGNOSIS:  RECURRENT BLADDER CANCER, RIGHT RENAL STONES  POST-OPERATIVE DIAGNOSIS:  RECURRENT BLADDER CANCER, RIGHT RENAL STONES  PROCEDURE:  Procedure(s): TRANSURETHRAL RESECTION OF BLADDER TUMOR WITH GYRUS (TURBT-GYRUS) (N/A) CYSTOSCOPY WITH RIGHT  RETROGRADE PYELOGRAM, RIGHT DIAGNOSTIC  URETEROSCOPY AND STENT PLACEMENT (Right)  SURGEON:  Surgeon(s) and Role:    * Alexis Frock, MD - Primary  PHYSICIAN ASSISTANT:   ASSISTANTS: none   ANESTHESIA:   general  EBL:  Total I/O In: 200 [I.V.:200] Out: -   BLOOD ADMINISTERED:none  DRAINS: 66F foley, plugged   LOCAL MEDICATIONS USED:  NONE  SPECIMEN:  Source of Specimen:  1 - recurrent bladder tumor  DISPOSITION OF SPECIMEN:  PATHOLOGY  COUNTS:  YES  TOURNIQUET:  * No tourniquets in log *  DICTATION: .Other Dictation: Dictation Number [id number]  PLAN OF CARE: Discharge to home after PACU  PATIENT DISPOSITION:  PACU - hemodynamically stable.   Delay start of Pharmacological VTE agent (>24hrs) due to surgical blood loss or risk of bleeding: yes

## 2015-04-21 NOTE — Anesthesia Postprocedure Evaluation (Signed)
  Anesthesia Post-op Note  Patient: [name]  Procedure(s) Performed: Procedure(s) (LRB): TRANSURETHRAL RESECTION OF BLADDER TUMOR WITH GYRUS (TURBT-GYRUS) (N/A) CYSTOSCOPY WITH RIGHT  RETROGRADE PYELOGRAM, RIGHT DIAGNOSTIC  URETEROSCOPY AND STENT PLACEMENT (Right)  Patient Location: PACU  Anesthesia Type: General  Level of Consciousness: awake and alert   Airway and Oxygen Therapy: Patient Spontanous Breathing  Post-op Pain: mild  Post-op Assessment: Post-op Vital signs reviewed, Patient's Cardiovascular Status Stable, Respiratory Function Stable, Patent Airway and No signs of Nausea or vomiting  Last Vitals:  Filed Vitals:   04/21/15 1444  BP: 140/79  Pulse: 56  Temp: 36.6 C  Resp: 13    Post-op Vital Signs: stable   Complications: No apparent anesthesia complications

## 2015-04-21 NOTE — H&P (Signed)
[name] is an 64 y.o. female.    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  Recent Surveillance: 05/2014 cysto NED; 08/2014 cysto NED 03/2015 multifocal recurrence (<1cm papillary lesion x 5-6)  2 -Nephrolithiasis - Prior composition CaOx, 24 hr urines with mild hypercalciuria (251).  Pre 2016 - medical passage x1 02/2015 - medical passage, Composition 65% CaOx / 35% CaPO4. Normal serum lytes. CT with left punctate, Rt 22mm lower pole.   3 - Urinary Urgency / Frequency - on oxybutynin prn for irritative symptoms, no incontinence and she uses meds very rarely  4 - Bilateral Renal Cysts - incidental non-complex bilateral cysts by CT 10/2013.          PMH sig for ortho surgeries. cesarean, sigmoid colectomy (diverticulosis). No CV disease. Her PCP is Jonathon Jordan MD.  Today Sevin is seen to proceed with transurethral resection of bladder tumor and right ureteroscopic stone manipulation. No interval fevers. Most recent UA withtou infectious parameters.     Past Medical History  Diagnosis Date  . Arthritis     LUMBAR SPINE / BACK PAIN  . Hyperlipidemia   . Migraine   . History of diverticulitis of colon     s/p  sigmoid colectomy  . History of hiatal hernia   . Nephrolithiasis     bilateral  . PONV (postoperative nausea and vomiting)   . Bladder cancer     recurrent  . Seasonal allergies   . Lower urinary tract symptoms (LUTS)   . Wears glasses   . Poison oak     LEGS AND THIGH    Past Surgical History  Procedure Laterality Date  . C-sections  1983  . Orif left elbow  2000  . Orif left foot  2012  . Blepharoplasty - bilateral    . Transurethral resection of bladder tumor with gyrus (turbt-gyrus) N/A 06/19/2013    Procedure: TRANSURETHRAL RESECTION OF BLADDER TUMOR WITH GYRUS  (TURBT-GYRUS);  Surgeon: Molli Hazard, MD;  Location: [facility];  Service: Urology;  Laterality: N/A;  . Cystoscopy w/ retrogrades Bilateral 06/19/2013    Procedure: CYSTOSCOPY WITH RETROGRADE PYELOGRAM;  Surgeon: Molli Hazard, MD;  Location: [facility];  Service: Urology;  Laterality: Bilateral;  . Esophagogastroduodenoscopy N/A 08/24/2014    Procedure: ESOPHAGOGASTRODUODENOSCOPY (EGD);  Surgeon: Lear Ng, MD;  Location: [facility];  Service: Endoscopy;  Laterality: N/A;  . Bravo ph study N/A 08/24/2014    Procedure: BRAVO [place];  Surgeon: Lear Ng, MD;  Location: [facility];  Service: Endoscopy;  Laterality: N/A;  . Shoulder arthroscopy Left   . Dilation and curettage of uterus  1982    W/ SUCTION  . Laparoscopic sigmoid colectomy  2004  . Repair tendon's and removal bone fragments, left elbow  2001  . Negative sleep study  2014  per pt  . Cardiovascular stress test  02-15-2009  DR SKAINS    normal perfusion study/  no evidence ischemia,  normal LV function and wall motion, ef 75%    Family History  Problem Relation Age of Onset  . Hypertension Brother    Social History:  reports that she has never smoked. She has never used smokeless tobacco. She reports that she does not drink alcohol or use illicit drugs.  Allergies:  Allergies  Allergen Reactions  . Hydrocodone-Acetaminophen Other (See Comments)    REACTION: nightmares  . Pseudoephedrine Other (See Comments)    REACTION: makes heart race  . Sulfa Antibiotics Hives  . Avelox [Moxifloxacin] Rash    No prescriptions prior to admission    No results found for this or any previous visit (from the past 69 hour(s)). No results found.  Review of Systems  Constitutional: Negative.  Negative for fever.  HENT: Negative.   Eyes: Negative.   Respiratory: Negative.   Cardiovascular: Negative.   Gastrointestinal: Negative.   Genitourinary: Negative.   Musculoskeletal: Negative.   Skin:  Negative.   Neurological: Negative.   Endo/Heme/Allergies: Negative.   Psychiatric/Behavioral: Negative.     Height 5\' 3"  (1.6 m), weight 53.524 kg (118 lb). Physical Exam  Constitutional: She appears well-developed.  HENT:  Head: Normocephalic.  Eyes: Pupils are equal, round, and reactive to light.  Neck: Normal range of motion.  Cardiovascular: Normal rate.   Respiratory: Effort normal.  GI: Soft.  Genitourinary:  No CVAT  Musculoskeletal: Normal range of motion.  Neurological: She is alert.  Skin: Skin is warm.  Psychiatric: She has a normal mood and affect. Her behavior is normal. Judgment and thought content normal.     Assessment/Plan   1 - Low Grade Bladder Cancer - recurrence today that is multifocal but very early. Warrants TURBT / bilat retrogardes in elective fashion, will also plan for Mito-C. Recent CT w/o additional worrisoem findings.    We rediscussed operative biopsy / transurethral resection as the best next step for diagnostic and therapeutic purposes with goals being to remove all visible cancer and obtain tissue for pathologic exam. We rediscussed that for some low-grade tumors, this may be all the treatment required, but that for many other tumors such as high-grade lesions, further therapy including surgery and or chemotherapy may be warranted. We alsore outlined the fact that any bladder cancer diagnosis will require close follow-up with periodic upper and lower tract evaluation. We discussed risks including bleeding, infection, damage to kidney / ureter / bladder including bladder perforation which can typically managed with prolonged foley catheterization. We rementioned anesthetic and other rare risks including DVT, PE, MI, and mortality. I also mentioned that adjunctive procedures such as ureteral stenting, retrograde pyelography, and ureteroscopy may be necessary to fully evaluate the urinary tract depending on intra-operative findings.   After answering  all questions to the patient's satisfaction, they wish to proceed today as planned.    2 -Nephrolithiasis - only small stone burden by most recent imaging and asymptomatic. Discused observation v. Rt URS at time of TURBT and she opts for latter. Additional risks includign need for peri-op stent discussed.   3 - Urinary Urgency / Frequency - doing well on oxybutynin, continue prn.  4 - Bilateral Renal Cysts - non-complex, no indication for dedicated surveillance, observe.        Taeler Winning 04/21/2015, 8:22 AM

## 2015-04-22 ENCOUNTER — Encounter (HOSPITAL_BASED_OUTPATIENT_CLINIC_OR_DEPARTMENT_OTHER): Payer: Self-pay | Admitting: Urology

## 2015-04-22 NOTE — Op Note (Signed)
[name], Wyatt NO.:  [id number]  MEDICAL RECORD NO.:  [id number]  LOCATION:                               FACILITY:  [facility]  PHYSICIAN:  Alexis Frock, MD     DATE OF BIRTH:  [birth year]  DATE OF PROCEDURE:  04/21/2015                               OPERATIVE REPORT   DIAGNOSES: 1. Recurrent bladder tumor. 2. Right renal stone.  PROCEDURES: 1. Cystoscopy with right retrograde pyelogram and interpretation. 2. Right diagnostic ureteroscopy. 3. Insertion of right ureteral stent, 5 x 24, Polaris, no tether. 4. Transurethral resection of bladder tumor, volume medium. 5. Instillation of mitomycin-C chemotherapeutic agent.  ESTIMATED BLOOD LOSS:  Nil.  COMPLICATIONS:  None.  SPECIMENS:  Recurrent bladder tumor for permanent pathology.  FINDINGS: 1. Multifocal papillary recurrent bladder tumor mostly in the     posterior and left upper areas of the bladder, total volume     approximately 2.5 centimeter squared. 2. Narrow caliber, somewhat tortuous right ureter, did not allow     ureteroscopy to the level of renal pelvis. 3. Successful placement of right ureteral stent, proximal and renal     pelvis, distal in the urinary bladder.  INDICATION:  [name] is a pleasant 64 year old lady with history of prior bladder tumor as well as nephrolithiasis.  She was found on surveillance cystoscopy to have recurrent multifocal bladder tumor as well as a recurrent nephrolithiasis.  She passed a ureteral stone recently; however, imaging revealed residual right-sided 5-mm intrarenal stone, only punctate left renal stones.  Options were discussed for management including transurethral resection of bladder tumor with and without addressing her nephrolithiasis and she wished to proceed with transurethral resection of bladder tumor with right-sided ureteroscopic stone manipulation.  Informed consent was obtained and placed in the medical record.  PROCEDURE IN DETAIL:   The patient being [name], was verified. Procedure being right ureteroscopic stone manipulation and transurethral resection of bladder tumor was confirmed.  Procedure was carried out. Time-out was performed.  Intravenous antibiotics were administered. General LMA anesthesia was induced.  The patient was placed into a low lithotomy position and sterile field was created by prepping and draping the patient's vagina, introitus and proximal thighs using iodine x3. Next, cystourethroscopy was performed using a 23-French rigid cystoscope with 30-degree offset lens.  Inspection of the urinary bladder revealed multifocal recurrent bladder tumor mostly in the posterior wall and left upper area.  This was papillary and somewhat low-grade appearing.  There were multiple foci, two of which approximately 1 cm each and two separate foci that were less than 1 cm, total surface area approximately 2.5 centimeter squared.  There was a prior old resection site very near the right ureteral orifice that was well healed without recurrence. Both ureteral orifices were visibly patent.  Attention was initially directed at addressing her stone.  The right ureter was cannulated with a 6-French end-hold catheter and right retrograde pyelogram was obtained.  Right retrograde pyelogram demonstrated a single right ureter with single-system right kidney.  No filling defects or narrowing noted. Ureter was somewhat narrow in caliber.  A 0.038 zip wire was advanced at the level of the upper  pole and set aside as a safety wire.  Next, semi- rigid ureteroscopy was performed to the distal two-thirds of the right ureter, alongside a separate Sensor working wire.  Ureter was somewhat narrow in caliber, but did allow passage of the semi-rigid ureteroscope into the proximal third of the ureter.  This was exchanged for a 12/14, 24-cm ureteral access sheath at the level of the proximal third of the ureter under continuous  ureteroscopic vision and flexible digital ureteroscopy was performed to the proximal ureter using a single-channel flexible ureteroscope chosen for its narrower caliber.  This did allow inspection towards the area of the ureteropelvic junction.  In this area, the ureter became even more narrow in caliber and somewhat tortuous such that it would not easily allow safe navigation of the ureteroscope to the level of the renal pelvis or direct visualization of the stone.  So, the safest way to proceed would be to place ureteral stent to allow for passive dilation and discussed with the patient stage procedure versus leaving this stent in situ.  As such, the access sheath was removed under continuous ureteroscopic vision.  No mucosal abnormalities were found and the safety wire was left in situ at this time.  The cystoscope was exchanged for 26-French continuous flow resectoscope sheath with obturator and very careful systematic resection was performed of these multiple foci of bladder tumor down what appeared to be the fibromuscular stroma of the bladder.  These tissue fragment was set aside for permanent pathology, labeled recurrent bladder tumor. Given the patient's small physique and somewhat thin-appearing bladder and relatively favorable-appearing bladder tumor, grossly decision was made not to perform deep muscular bites.  As such, coagulation current was applied to the base of these previous resection sites, resulted in excellent hemostasis.  There was no evidence of perforation.  A new 5 x 24 Polaris-type sent was placed for the remaining safety wire using fluoroscopic guidance.  Good proximal and distal deployment were noted. As the patient's bladder tumor has been recurrent, it was felt that mitomycin instillation was clearly warranted to help prevent recurrence. As such, a new 16-French Foley catheter was placed per urethra to straight drain.  The bladder was emptied, and a 40 mg and  40 mL of mitomycin was instilled into the urinary bladder and a catheter plug placed.  This was held in place for approximately 1 hour in the postanesthesia care unit, drained and the catheter removed.  The patient tolerated the procedure well.  There were no immediate periprocedural complications.  The patient was taken to the postanesthesia care unit in stable condition.          ______________________________ Alexis Frock, MD     TM/MEDQ  D:  04/21/2015  T:  04/22/2015  Job:  [id number]

## 2015-05-05 ENCOUNTER — Ambulatory Visit
Admission: RE | Admit: 2015-05-05 | Discharge: 2015-05-05 | Disposition: A | Discharge status: Home / Self Care | Payer: BC Managed Care – PPO | Source: Ambulatory Visit | Attending: Obstetrics and Gynecology | Admitting: Obstetrics and Gynecology

## 2015-05-05 ENCOUNTER — Other Ambulatory Visit: Payer: Self-pay | Admitting: Obstetrics and Gynecology

## 2015-05-05 DIAGNOSIS — N644 Mastodynia: Secondary | ICD-10-CM

## 2015-05-07 ENCOUNTER — Other Ambulatory Visit: Payer: Self-pay | Admitting: Urology

## 2015-05-12 ENCOUNTER — Encounter (HOSPITAL_BASED_OUTPATIENT_CLINIC_OR_DEPARTMENT_OTHER): Payer: Self-pay | Admitting: *Deleted

## 2015-05-12 NOTE — Progress Notes (Signed)
NPO AFTER MN.  ARRIVE AT 0930.  NEEDS ISTAT 8.  MAY TAKE PRN MEDS AM DOS W/ SIPS OF WATER.

## 2015-05-13 ENCOUNTER — Encounter (HOSPITAL_BASED_OUTPATIENT_CLINIC_OR_DEPARTMENT_OTHER): Payer: Self-pay | Admitting: *Deleted

## 2015-05-13 ENCOUNTER — Ambulatory Visit (HOSPITAL_BASED_OUTPATIENT_CLINIC_OR_DEPARTMENT_OTHER): Payer: BC Managed Care – PPO | Admitting: Anesthesiology

## 2015-05-13 ENCOUNTER — Ambulatory Visit (HOSPITAL_BASED_OUTPATIENT_CLINIC_OR_DEPARTMENT_OTHER)
Admission: RE | Admit: 2015-05-13 | Discharge: 2015-05-13 | Disposition: A | Discharge status: Home / Self Care | Payer: BC Managed Care – PPO | Source: Ambulatory Visit | Attending: Urology | Admitting: Urology

## 2015-05-13 ENCOUNTER — Encounter (HOSPITAL_COMMUNITY): Payer: Self-pay

## 2015-05-13 ENCOUNTER — Encounter (HOSPITAL_BASED_OUTPATIENT_CLINIC_OR_DEPARTMENT_OTHER)
Admission: RE | Disposition: A | Discharge status: Home / Self Care | Payer: Self-pay | Source: Ambulatory Visit | Attending: Urology

## 2015-05-13 ENCOUNTER — Emergency Department (HOSPITAL_COMMUNITY)
Admission: EM | Admit: 2015-05-13 | Discharge: 2015-05-14 | Disposition: A | Discharge status: Home / Self Care | Payer: BC Managed Care – PPO | Attending: Emergency Medicine | Admitting: Emergency Medicine

## 2015-05-13 DIAGNOSIS — G43909 Migraine, unspecified, not intractable, without status migrainosus: Secondary | ICD-10-CM | POA: Diagnosis present

## 2015-05-13 DIAGNOSIS — M4696 Unspecified inflammatory spondylopathy, lumbar region: Secondary | ICD-10-CM | POA: Insufficient documentation

## 2015-05-13 DIAGNOSIS — Z8719 Personal history of other diseases of the digestive system: Secondary | ICD-10-CM | POA: Diagnosis not present

## 2015-05-13 DIAGNOSIS — K219 Gastro-esophageal reflux disease without esophagitis: Secondary | ICD-10-CM | POA: Diagnosis not present

## 2015-05-13 DIAGNOSIS — N289 Disorder of kidney and ureter, unspecified: Secondary | ICD-10-CM | POA: Insufficient documentation

## 2015-05-13 DIAGNOSIS — Z79899 Other long term (current) drug therapy: Secondary | ICD-10-CM | POA: Insufficient documentation

## 2015-05-13 DIAGNOSIS — N135 Crossing vessel and stricture of ureter without hydronephrosis: Secondary | ICD-10-CM | POA: Insufficient documentation

## 2015-05-13 DIAGNOSIS — N200 Calculus of kidney: Secondary | ICD-10-CM | POA: Diagnosis not present

## 2015-05-13 DIAGNOSIS — Z8551 Personal history of malignant neoplasm of bladder: Secondary | ICD-10-CM | POA: Insufficient documentation

## 2015-05-13 DIAGNOSIS — G43809 Other migraine, not intractable, without status migrainosus: Secondary | ICD-10-CM | POA: Insufficient documentation

## 2015-05-13 DIAGNOSIS — Z87448 Personal history of other diseases of urinary system: Secondary | ICD-10-CM | POA: Diagnosis not present

## 2015-05-13 DIAGNOSIS — E785 Hyperlipidemia, unspecified: Secondary | ICD-10-CM | POA: Insufficient documentation

## 2015-05-13 HISTORY — PX: STONE EXTRACTION WITH BASKET: SHX5318

## 2015-05-13 HISTORY — PX: CYSTOSCOPY WITH RETROGRADE PYELOGRAM, URETEROSCOPY AND STENT PLACEMENT: SHX5789

## 2015-05-13 LAB — POCT I-STAT, CHEM 8
BUN: 22 mg/dL — ABNORMAL HIGH (ref 6–20)
Calcium, Ion: 1.26 mmol/L (ref 1.13–1.30)
Chloride: 107 mmol/L (ref 101–111)
Creatinine, Ser: 0.7 mg/dL (ref 0.44–1.00)
GLUCOSE: 92 mg/dL (ref 65–99)
HEMATOCRIT: 41 % (ref 36.0–46.0)
HEMOGLOBIN: 13.9 g/dL (ref 12.0–15.0)
Potassium: 4 mmol/L (ref 3.5–5.1)
Sodium: 141 mmol/L (ref 135–145)
TCO2: 23 mmol/L (ref 0–100)

## 2015-05-13 SURGERY — CYSTOURETEROSCOPY, WITH RETROGRADE PYELOGRAM AND STENT INSERTION
Anesthesia: General | Site: Ureter | Laterality: Right

## 2015-05-13 MED ORDER — FENTANYL CITRATE (PF) 100 MCG/2ML IJ SOLN
INTRAMUSCULAR | Status: DC | PRN
  Administered 2015-05-13 (×2): 50 ug via INTRAVENOUS

## 2015-05-13 MED ORDER — SODIUM CHLORIDE 0.9 % IR SOLN
Status: DC | PRN
  Administered 2015-05-13: 3000 mL

## 2015-05-13 MED ORDER — DEXAMETHASONE SODIUM PHOSPHATE 4 MG/ML IJ SOLN
INTRAMUSCULAR | Status: DC | PRN
Start: 2015-05-13 — End: 2015-05-13
  Administered 2015-05-13: 10 mg via INTRAVENOUS

## 2015-05-13 MED ORDER — LIDOCAINE HCL (CARDIAC) 20 MG/ML IV SOLN
INTRAVENOUS | Status: DC | PRN
  Administered 2015-05-13: 40 mg via INTRAVENOUS

## 2015-05-13 MED ORDER — IOHEXOL 350 MG/ML SOLN
INTRAVENOUS | Status: DC | PRN
  Administered 2015-05-13: 8 mL via URETHRAL

## 2015-05-13 MED ORDER — FENTANYL CITRATE (PF) 100 MCG/2ML IJ SOLN
INTRAMUSCULAR | Status: AC
  Filled 2015-05-13: qty 4

## 2015-05-13 MED ORDER — GENTAMICIN IN SALINE 1.6-0.9 MG/ML-% IV SOLN
80.0000 mg | INTRAVENOUS | Status: AC
  Administered 2015-05-13: 260 mg via INTRAVENOUS
  Filled 2015-05-13: qty 50

## 2015-05-13 MED ORDER — OXYCODONE-ACETAMINOPHEN 5-325 MG PO TABS: 1.0000 | ORAL_TABLET | Freq: Four times a day (QID) | ORAL | Status: DC | PRN

## 2015-05-13 MED ORDER — LACTATED RINGERS IV SOLN
INTRAVENOUS | Status: DC
  Filled 2015-05-13: qty 1000

## 2015-05-13 MED ORDER — FENTANYL CITRATE (PF) 100 MCG/2ML IJ SOLN
25.0000 ug | INTRAMUSCULAR | Status: DC | PRN
  Administered 2015-05-13: 50 ug via INTRAVENOUS
  Filled 2015-05-13: qty 1

## 2015-05-13 MED ORDER — OXYCODONE-ACETAMINOPHEN 5-325 MG PO TABS
1.0000 | ORAL_TABLET | Freq: Once | ORAL | Status: AC
  Administered 2015-05-13: 1 via ORAL
  Filled 2015-05-13: qty 2

## 2015-05-13 MED ORDER — SENNOSIDES-DOCUSATE SODIUM 8.6-50 MG PO TABS: 1.0000 | ORAL_TABLET | Freq: Two times a day (BID) | ORAL | Status: DC

## 2015-05-13 MED ORDER — OXYCODONE-ACETAMINOPHEN 5-325 MG PO TABS
ORAL_TABLET | ORAL | Status: AC
Start: 2015-05-13 — End: 2015-05-13
  Filled 2015-05-13: qty 1

## 2015-05-13 MED ORDER — CEPHALEXIN 500 MG PO CAPS: 500.0000 mg | ORAL_CAPSULE | Freq: Two times a day (BID) | ORAL | Status: DC

## 2015-05-13 MED ORDER — ONDANSETRON HCL 4 MG/2ML IJ SOLN
INTRAMUSCULAR | Status: DC | PRN
  Administered 2015-05-13: 4 mg via INTRAVENOUS

## 2015-05-13 MED ORDER — PROPOFOL 10 MG/ML IV BOLUS
INTRAVENOUS | Status: DC | PRN
  Administered 2015-05-13: 120 mg via INTRAVENOUS

## 2015-05-13 MED ORDER — KETOROLAC TROMETHAMINE 30 MG/ML IJ SOLN
INTRAMUSCULAR | Status: DC | PRN
  Administered 2015-05-13: 30 mg via INTRAVENOUS

## 2015-05-13 MED ORDER — LACTATED RINGERS IV SOLN
INTRAVENOUS | Status: DC
  Administered 2015-05-13 (×2): via INTRAVENOUS
  Filled 2015-05-13: qty 1000

## 2015-05-13 MED ORDER — MIDAZOLAM HCL 5 MG/5ML IJ SOLN
INTRAMUSCULAR | Status: DC | PRN
  Administered 2015-05-13: 2 mg via INTRAVENOUS

## 2015-05-13 MED ORDER — FENTANYL CITRATE (PF) 100 MCG/2ML IJ SOLN
INTRAMUSCULAR | Status: AC
  Filled 2015-05-13: qty 2

## 2015-05-13 MED ORDER — MIDAZOLAM HCL 2 MG/2ML IJ SOLN
INTRAMUSCULAR | Status: AC
  Filled 2015-05-13: qty 2

## 2015-05-13 SURGICAL SUPPLY — 38 items
BAG DRAIN URO-CYSTO SKYTR STRL (DRAIN) ×3 IMPLANT
BASKET LASER NITINOL 1.9FR (BASKET) ×3 IMPLANT
BASKET STNLS GEMINI 4WIRE 3FR (BASKET) IMPLANT
BASKET ZERO TIP NITINOL 2.4FR (BASKET) IMPLANT
CANISTER SUCT LVC 12 LTR MEDI- (MISCELLANEOUS) ×3 IMPLANT
CATH INTERMIT  6FR 70CM (CATHETERS) ×3 IMPLANT
CATH URET 5FR 28IN CONE TIP (BALLOONS)
CATH URET 5FR 28IN OPEN ENDED (CATHETERS) IMPLANT
CATH URET 5FR 70CM CONE TIP (BALLOONS) IMPLANT
CLOTH BEACON ORANGE TIMEOUT ST (SAFETY) ×3 IMPLANT
ELECT REM PT RETURN 9FT ADLT (ELECTROSURGICAL)
ELECTRODE REM PT RTRN 9FT ADLT (ELECTROSURGICAL) IMPLANT
FIBER LASER FLEXIVA 365 (UROLOGICAL SUPPLIES) IMPLANT
FIBER LASER TRAC TIP (UROLOGICAL SUPPLIES) IMPLANT
GLOVE BIO SURGEON STRL SZ 6.5 (GLOVE) ×3 IMPLANT
GLOVE BIO SURGEON STRL SZ7.5 (GLOVE) ×3 IMPLANT
GLOVE INDICATOR 6.5 STRL GRN (GLOVE) ×6 IMPLANT
GOWN STRL REUS W/ TWL LRG LVL3 (GOWN DISPOSABLE) ×2 IMPLANT
GOWN STRL REUS W/ TWL XL LVL3 (GOWN DISPOSABLE) ×2 IMPLANT
GOWN STRL REUS W/TWL LRG LVL3 (GOWN DISPOSABLE) ×1
GOWN STRL REUS W/TWL XL LVL3 (GOWN DISPOSABLE) ×1
GUIDEWIRE 0.038 PTFE COATED (WIRE) IMPLANT
GUIDEWIRE ANG ZIPWIRE 038X150 (WIRE) ×3 IMPLANT
GUIDEWIRE STR DUAL SENSOR (WIRE) ×3 IMPLANT
IV NS 1000ML (IV SOLUTION) ×1
IV NS 1000ML BAXH (IV SOLUTION) ×2 IMPLANT
IV NS IRRIG 3000ML ARTHROMATIC (IV SOLUTION) ×3 IMPLANT
KIT BALLIN UROMAX 15FX10 (LABEL) IMPLANT
KIT BALLN UROMAX 15FX4 (MISCELLANEOUS) IMPLANT
KIT BALLN UROMAX 26 75X4 (MISCELLANEOUS)
MANIFOLD NEPTUNE II (INSTRUMENTS) IMPLANT
PACK CYSTO (CUSTOM PROCEDURE TRAY) ×3 IMPLANT
SET HIGH PRES BAL DIL (LABEL)
SHEATH ACCESS URETERAL 24CM (SHEATH) ×3 IMPLANT
STENT POLARIS 5FRX22 (STENTS) ×3 IMPLANT
SYRINGE 10CC LL (SYRINGE) ×3 IMPLANT
SYRINGE IRR TOOMEY STRL 70CC (SYRINGE) IMPLANT
TUBE FEEDING 8FR 16IN STR KANG (MISCELLANEOUS) IMPLANT

## 2015-05-13 NOTE — Anesthesia Procedure Notes (Signed)
Procedure Name: LMA Insertion Date/Time: 05/13/2015 10:39 AM Performed by: Wanita Chamberlain Pre-anesthesia Checklist: Patient identified, Timeout performed, Emergency Drugs available, Suction available and Patient being monitored Patient Re-evaluated:Patient Re-evaluated prior to inductionOxygen Delivery Method: Circle system utilized Preoxygenation: Pre-oxygenation with 100% oxygen Intubation Type: IV induction Ventilation: Mask ventilation without difficulty LMA: LMA inserted LMA Size: 4.0 Number of attempts: 1 Airway Equipment and Method: Bite block Placement Confirmation: positive ETCO2 and breath sounds checked- equal and bilateral Tube secured with: Tape Dental Injury: Teeth and Oropharynx as per pre-operative assessment

## 2015-05-13 NOTE — Discharge Instructions (Signed)
1 - You may have urinary urgency (bladder spasms) and bloody urine on / off with stent in place. This is normal. ° °2 - Call MD or go to ER for fever >102, severe pain / nausea / vomiting not relieved by medications, or acute change in medical status °[facility] °[phone] °Post Ureteroscopy With or Without Stent Instructions ° °Definitions: ° °Ureter: The duct that transports urine from the kidney to the bladder. °Stent:   A plastic hollow tube that is placed into the ureter, from the kidney to the                 bladder to prevent the ureter from swelling shut. ° °GENERAL INSTRUCTIONS: ° °Despite the fact that no skin incisions were used, the area around the ureter and bladder is raw and irritated. The stent is a foreign body which will further irritate the bladder wall. This irritation is manifested by increased frequency of urination, both day and night, and by an increase in the urge to urinate. In some, the urge to urinate is present almost always. Sometimes the urge is strong enough that you may not be able to stop yourself from urinating. The only real cure is to remove the stent and then give time for the bladder wall to heal which can't be done until the danger of the ureter swelling shut has passed, which varies. ° °You may see some blood in your urine while the stent is in place and a few days afterwards. Do not be alarmed, even if the urine was clear for a while. Get off your feet and drink lots of fluids until clearing occurs. If you start to pass clots or don't improve, call us. ° °DIET: °You may return to your normal diet immediately. Because of the raw surface of your bladder, alcohol, spicy foods, acid type foods and drinks with caffeine may cause irritation or frequency and should be used in moderation. To keep your urine flowing freely and to avoid constipation, drink plenty of fluids during the day ( 8-10 glasses ). °Tip: Avoid cranberry juice because it is very  acidic. ° °ACTIVITY: °Your physical activity doesn't need to be restricted. However, if you are very active, you may see some blood in your urine. We suggest that you reduce your activity under these circumstances until the bleeding has stopped. ° °BOWELS: °It is important to keep your bowels regular during the postoperative period. Straining with bowel movements can cause bleeding. A bowel movement every other day is reasonable. Use a mild laxative if needed, such as Milk of Magnesia 2-3 tablespoons, or 2 Dulcolax tablets. Call if you continue to have problems. If you have been taking narcotics for pain, before, during or after your surgery, you may be constipated. Take a laxative if necessary. ° ° °MEDICATION: °You should resume your pre-surgery medications unless told not to. In addition you will often be given an antibiotic to prevent infection. These should be taken as prescribed until the bottles are finished unless you are having an unusual reaction to one of the drugs. ° °PROBLEMS YOU SHOULD REPORT TO US: °· Fevers over 100.5 Fahrenheit. °· Heavy bleeding, or clots ( See above notes about blood in urine ). °· Inability to urinate. °· Drug reactions ( hives, rash, nausea, vomiting, diarrhea ). °· Severe burning or pain with urination that is not improving. ° °FOLLOW-UP: °You will need a follow-up appointment to monitor your progress. Call for this appointment at the number listed above.   Usually the first appointment will be about three to fourteen days after your surgery. ° ° ° ° ° °Post Anesthesia Home Care Instructions ° °Activity: °Get plenty of rest for the remainder of the day. A responsible adult should stay with you for 24 hours following the procedure.  °For the next 24 hours, DO NOT: °-Drive a car °-Operate machinery °-Drink alcoholic beverages °-Take any medication unless instructed by your physician °-Make any legal decisions or sign important papers. ° °Meals: °Start with liquid foods such as  gelatin or soup. Progress to regular foods as tolerated. Avoid greasy, spicy, heavy foods. If nausea and/or vomiting occur, drink only clear liquids until the nausea and/or vomiting subsides. Call your physician if vomiting continues. ° °Special Instructions/Symptoms: °Your throat may feel dry or sore from the anesthesia or the breathing tube placed in your throat during surgery. If this causes discomfort, gargle with warm salt water. The discomfort should disappear within 24 hours. ° °If you had a scopolamine patch placed behind your ear for the management of post- operative nausea and/or vomiting: ° °1. The medication in the patch is effective for 72 hours, after which it should be removed.  Wrap patch in a tissue and discard in the trash. Wash hands thoroughly with soap and water. °2. You may remove the patch earlier than 72 hours if you experience unpleasant side effects which may include dry mouth, dizziness or visual disturbances. °3. Avoid touching the patch. Wash your hands with soap and water after contact with the patch. °  ° °

## 2015-05-13 NOTE — OR Nursing (Signed)
Right ureteral stone taking by Dr. Tresa Moore.

## 2015-05-13 NOTE — H&P (Signed)
[name] is an 64 y.o. female.    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  Recent Surveillance: 05/2014 cysto NED; 08/2014 cysto NED 03/2015 multifocal recurrence (<1cm papillary lesion x 5-6) ==> TaG1 by TURBT and Mito-C (Rt stent placed possible URS)  2 -Nephrolithiasis - Prior composition CaOx, 24 hr urines with mild hypercalciuria (251).  Pre 2016 - medical passage x1 02/2015 - medical passage, Composition 65% CaOx / 35% CaPO4. Normal serum lytes. CT with left punctate, Rt 81mm lower pole.     PMH sig for ortho surgeries. cesarean, sigmoid colectomy (diverticulosis). No CV disease. [name] PCP is Rebekah Jordan MD.  Today [name] is seen to proceed with re-attempt right ureteroscopic stone manipulation. She had interval ureteral stent to allow for passive dilation. No interval fevers. [name] most recent UCX was non-specific low-growth for which she has started keflex prior to today.   Past Medical History  Diagnosis Date  . Arthritis     LUMBAR SPINE / BACK PAIN  . Hyperlipidemia   . Migraine   . History of diverticulitis of colon     s/p  sigmoid colectomy  . History of hiatal hernia   . Nephrolithiasis     bilateral  . PONV (postoperative nausea and vomiting)   . Bladder cancer     recurrent  . Seasonal allergies   . Lower urinary tract symptoms (LUTS)   . Wears glasses     Past Surgical History  Procedure Laterality Date  . C-sections  1983  . Orif left elbow  2000  . Orif left foot  2012  . Blepharoplasty - bilateral    . Transurethral resection of bladder tumor with gyrus (turbt-gyrus) N/A 06/19/2013    Procedure: TRANSURETHRAL RESECTION OF BLADDER TUMOR WITH GYRUS (TURBT-GYRUS);  Surgeon: Molli Hazard, MD;  Location: [facility];  Service: Urology;  Laterality: N/A;  . Cystoscopy w/ retrogrades Bilateral 06/19/2013    Procedure:  CYSTOSCOPY WITH RETROGRADE PYELOGRAM;  Surgeon: Molli Hazard, MD;  Location: [facility];  Service: Urology;  Laterality: Bilateral;  . Esophagogastroduodenoscopy N/A 08/24/2014    Procedure: ESOPHAGOGASTRODUODENOSCOPY (EGD);  Surgeon: Lear Ng, MD;  Location: [facility];  Service: Endoscopy;  Laterality: N/A;  . Bravo ph study N/A 08/24/2014    Procedure: BRAVO [place];  Surgeon: Lear Ng, MD;  Location: [facility];  Service: Endoscopy;  Laterality: N/A;  . Shoulder arthroscopy Left   . Dilation and curettage of uterus  1982    W/ SUCTION  . Laparoscopic sigmoid colectomy  2004  . Repair tendon's and removal bone fragments, left elbow  2001  . Negative sleep study  2014  per pt  . Cardiovascular stress test  02-15-2009  DR SKAINS    normal perfusion study/  no evidence ischemia,  normal LV function and wall motion, ef 75%  . Transurethral resection of bladder tumor with gyrus (turbt-gyrus) N/A 04/21/2015    Procedure: TRANSURETHRAL RESECTION OF BLADDER TUMOR WITH GYRUS (TURBT-GYRUS);  Surgeon: Alexis Frock, MD;  Location: [facility];  Service: Urology;  Laterality: N/A;  . Cystoscopy with retrograde pyelogram, ureteroscopy and stent placement Right 04/21/2015    Procedure: CYSTOSCOPY WITH RIGHT  RETROGRADE PYELOGRAM, RIGHT DIAGNOSTIC  URETEROSCOPY AND STENT PLACEMENT;  Surgeon: Alexis Frock, MD;  Location: [facility];  Service: Urology;  Laterality: Right;  Family History  Problem Relation Age of Onset  . Hypertension Brother    Social History:  reports that she has never smoked. She has never used smokeless tobacco. She reports that she does not drink alcohol or use illicit drugs.  Allergies:  Allergies  Allergen Reactions  . Hydrocodone-Acetaminophen Other (See Comments)    REACTION: nightmares  . Pseudoephedrine Other (See Comments)    REACTION: makes heart race  . Sulfa Antibiotics Hives  . Avelox [Moxifloxacin]  Rash    No prescriptions prior to admission    No results found for this or any previous visit (from the past 19 hour(s)). No results found.  Review of Systems  Constitutional: Negative.  Negative for fever and chills.  HENT: Negative.   Eyes: Negative.   Respiratory: Negative.   Cardiovascular: Negative.   Gastrointestinal: Negative.   Genitourinary: Positive for urgency and frequency.       Mild stent colic symptoms  Musculoskeletal: Negative.   Skin: Negative.   Neurological: Negative.   Endo/Heme/Allergies: Negative.   Psychiatric/Behavioral: Negative.     Height 5\' 3"  (1.6 m), weight 52.617 kg (116 lb). Physical Exam  Constitutional: She appears well-developed.  HENT:  Head: Normocephalic.  Eyes: Pupils are equal, round, and reactive to light.  Neck: Normal range of motion.  Cardiovascular: Normal rate.   Respiratory: Effort normal.  GI: Soft.  Genitourinary:  No CVAT  Musculoskeletal: Normal range of motion.  Neurological: She is alert.  Skin: Skin is warm.  Psychiatric: She has a normal mood and affect. [name] behavior is normal. Judgment and thought content normal.     Assessment/Plan  1 - Low Grade Bladder Cancer - most recent recurrence TaG1. Reinforced optimal plan for low-grade tumor with Q47mo cysto x 1 year, then Q52mo x 1 year, then annually and that no additional therapy is warranted at this point.  2 -Nephrolithiasis - only small stone burden by most recent imaging and asymptomatic. She has Rt JJ stent in situ for passive dilation and wants to proceed as planned with re-attempt right URS today with goal of stone free.  Risk, benefits, alternatives discussed gain.  [name] 05/13/2015, 6:20 AM

## 2015-05-13 NOTE — Transfer of Care (Signed)
Immediate Anesthesia Transfer of Care Note  Patient: [name]  Procedure(s) Performed: Procedure(s): CYSTOSCOPY WITH RETROGRADE PYELOGRAM, URETEROSCOPY AND STENT EXCHANGE (Right) STONE EXTRACTION WITH BASKET (Right)  Patient Location: PACU  Anesthesia Type:General  Level of Consciousness: awake, alert , oriented and patient cooperative  Airway & Oxygen Therapy: Patient Spontanous Breathing and Patient connected to nasal cannula oxygen  Post-op Assessment: Report given to RN and Post -op Vital signs reviewed and stable  Post vital signs: Reviewed and stable  Last Vitals:  Filed Vitals:   05/13/15 0923  BP: 112/78  Pulse: 70  Temp: 36.6 C  Resp: 10    Complications: No apparent anesthesia complications

## 2015-05-13 NOTE — Anesthesia Postprocedure Evaluation (Signed)
  Anesthesia Post-op Note  Patient: [name]  Procedure(s) Performed: Procedure(s) (LRB): CYSTOSCOPY WITH RETROGRADE PYELOGRAM, URETEROSCOPY AND STENT EXCHANGE (Right) STONE EXTRACTION WITH BASKET (Right)  Patient Location: PACU  Anesthesia Type: General  Level of Consciousness: awake and alert   Airway and Oxygen Therapy: Patient Spontanous Breathing  Post-op Pain: mild  Post-op Assessment: Post-op Vital signs reviewed, Patient's Cardiovascular Status Stable, Respiratory Function Stable, Patent Airway and No signs of Nausea or vomiting  Last Vitals:  Filed Vitals:   05/13/15 1233  BP:   Pulse: 58  Temp:   Resp: 11    Post-op Vital Signs: stable   Complications: No apparent anesthesia complications

## 2015-05-13 NOTE — ED Notes (Addendum)
Patient's spouse reports patient has had a migraine headache since yesterday.  Headache has worsened today.  Photophobia, nausea and vomiting present.   She had surgery for kidney stones today.  She has taken Vicodin, Tramadol, and a migraine medication today.

## 2015-05-13 NOTE — Anesthesia Preprocedure Evaluation (Addendum)
Anesthesia Evaluation  Patient identified by MRN, date of birth, ID band Patient awake    Reviewed: Allergy & Precautions, NPO status , Patient's Chart, lab work & pertinent test results  History of Anesthesia Complications (+) PONV and history of anesthetic complications  Airway Mallampati: II  TM Distance: >3 FB Neck ROM: Full    Dental no notable dental hx. (+) Dental Advisory Given, Teeth Intact   Pulmonary shortness of breath and with exertion,  breath sounds clear to auscultation  Pulmonary exam normal       Cardiovascular Exercise Tolerance: Good negative cardio ROS Normal cardiovascular examRhythm:Regular Rate:Normal  ECG : SB, LAFB,  Q waves V1 and V2   Neuro/Psych  Headaches,  Neuromuscular disease negative psych ROS   GI/Hepatic Neg liver ROS, hiatal hernia, GERD-  Medicated,  Endo/Other  negative endocrine ROS  Renal/GU Renal diseaseBladder cancer  negative genitourinary   Musculoskeletal  (+) Arthritis -,   Abdominal (+)  Abdomen: tender.    Peds negative pediatric ROS (+)  Hematology negative hematology ROS (+)   Anesthesia Other Findings   Reproductive/Obstetrics negative OB ROS                           Anesthesia Physical Anesthesia Plan  ASA: III  Anesthesia Plan: General   Post-op Pain Management:    Induction: Intravenous  Airway Management Planned: LMA  Additional Equipment:   Intra-op Plan:   Post-operative Plan:   Informed Consent: I have reviewed the patients History and Physical, chart, labs and discussed the procedure including the risks, benefits and alternatives for the proposed anesthesia with the patient or authorized representative who has indicated his/her understanding and acceptance.   Dental advisory given  Plan Discussed with: Surgeon, CRNA and Anesthesiologist  Anesthesia Plan Comments:        Anesthesia Quick Evaluation

## 2015-05-13 NOTE — Brief Op Note (Signed)
05/13/2015  11:17 AM  PATIENT:  [name]  64 y.o. female  PRE-OPERATIVE DIAGNOSIS:  RIGHT RENAL STONE  POST-OPERATIVE DIAGNOSIS:  RIGHT RENAL STONE  PROCEDURE:  Procedure(s): CYSTOSCOPY WITH RETROGRADE PYELOGRAM, URETEROSCOPY AND STENT EXCHANGE (Right)  SURGEON:  Surgeon(s) and Role:    * Alexis Frock, MD - Primary  PHYSICIAN ASSISTANT:   ASSISTANTS: none   ANESTHESIA:   general  EBL:     BLOOD ADMINISTERED:none  DRAINS: none   LOCAL MEDICATIONS USED:  NONE  SPECIMEN:  Source of Specimen:  Rt renal stone  DISPOSITION OF SPECIMEN:  [facility] for compositional analysis  COUNTS:  YES  TOURNIQUET:  * No tourniquets in log *  DICTATION: .Other Dictation: Dictation Number [id number]  PLAN OF CARE: Discharge to home after PACU  PATIENT DISPOSITION:  PACU - hemodynamically stable.   Delay start of Pharmacological VTE agent (>24hrs) due to surgical blood loss or risk of bleeding: yes

## 2015-05-14 ENCOUNTER — Emergency Department (HOSPITAL_COMMUNITY): Payer: BC Managed Care – PPO

## 2015-05-14 ENCOUNTER — Encounter (HOSPITAL_COMMUNITY): Payer: Self-pay | Admitting: Radiology

## 2015-05-14 MED ORDER — KETOROLAC TROMETHAMINE 30 MG/ML IJ SOLN
15.0000 mg | Freq: Once | INTRAMUSCULAR | Status: AC
  Administered 2015-05-14: 15 mg via INTRAVENOUS
  Filled 2015-05-14: qty 1

## 2015-05-14 MED ORDER — SODIUM CHLORIDE 0.9 % IV BOLUS (SEPSIS)
1000.0000 mL | Freq: Once | INTRAVENOUS | Status: AC
  Administered 2015-05-14: 1000 mL via INTRAVENOUS

## 2015-05-14 MED ORDER — ONDANSETRON HCL 4 MG/2ML IJ SOLN
4.0000 mg | Freq: Once | INTRAMUSCULAR | Status: AC
  Administered 2015-05-14: 4 mg via INTRAVENOUS
  Filled 2015-05-14: qty 2

## 2015-05-14 MED ORDER — METOCLOPRAMIDE HCL 5 MG/ML IJ SOLN
10.0000 mg | Freq: Once | INTRAMUSCULAR | Status: AC
  Administered 2015-05-14: 10 mg via INTRAVENOUS
  Filled 2015-05-14: qty 2

## 2015-05-14 MED ORDER — DIPHENHYDRAMINE HCL 50 MG/ML IJ SOLN
25.0000 mg | Freq: Once | INTRAMUSCULAR | Status: AC
  Administered 2015-05-14: 25 mg via INTRAVENOUS
  Filled 2015-05-14: qty 1

## 2015-05-14 NOTE — ED Provider Notes (Signed)
CSN: [id number]     Arrival date & time 05/13/15  2224 History   First MD Initiated Contact with Patient 05/13/15 2358     Chief Complaint  Patient presents with  . Migraine     (Consider location/radiation/quality/duration/timing/severity/associated sxs/prior Treatment) HPI Comments: Patient is a 64 year old female with a past medical history of chronic migraines who presents with a headache that started prior to arrival. Patient reports a sudden onset of "the worst headache of her life." The pain is sharp, constant and is located in generalized head without radiation. Patient has tried nothing for symptoms without relief. No alleviating/aggravating factors. Patient reports associated nausea, vomiting and photophobia. Patient denies fever, diarrhea, numbness/tingling, weakness, visual changes, congestion, chest pain, SOB, abdominal pain.     Patient is a 64 y.o. female presenting with migraines.  Migraine Associated symptoms include headaches, nausea and vomiting. Pertinent negatives include no abdominal pain, arthralgias, chest pain, chills, fatigue, fever, neck pain or weakness.    Past Medical History  Diagnosis Date  . Arthritis     LUMBAR SPINE / BACK PAIN  . Hyperlipidemia   . Migraine   . History of diverticulitis of colon     s/p  sigmoid colectomy  . History of hiatal hernia   . Nephrolithiasis     bilateral  . PONV (postoperative nausea and vomiting)   . Bladder cancer     recurrent  . Seasonal allergies   . Lower urinary tract symptoms (LUTS)   . Wears glasses    Past Surgical History  Procedure Laterality Date  . C-sections  1983  . Orif left elbow  2000  . Orif left foot  2012  . Blepharoplasty - bilateral    . Transurethral resection of bladder tumor with gyrus (turbt-gyrus) N/A 06/19/2013    Procedure: TRANSURETHRAL RESECTION OF BLADDER TUMOR WITH GYRUS (TURBT-GYRUS);  Surgeon: Molli Hazard, MD;  Location: [facility];  Service: Urology;  Laterality:  N/A;  . Cystoscopy w/ retrogrades Bilateral 06/19/2013    Procedure: CYSTOSCOPY WITH RETROGRADE PYELOGRAM;  Surgeon: Molli Hazard, MD;  Location: [facility];  Service: Urology;  Laterality: Bilateral;  . Esophagogastroduodenoscopy N/A 08/24/2014    Procedure: ESOPHAGOGASTRODUODENOSCOPY (EGD);  Surgeon: Lear Ng, MD;  Location: [facility];  Service: Endoscopy;  Laterality: N/A;  . Bravo ph study N/A 08/24/2014    Procedure: BRAVO [place];  Surgeon: Lear Ng, MD;  Location: [facility];  Service: Endoscopy;  Laterality: N/A;  . Shoulder arthroscopy Left   . Dilation and curettage of uterus  1982    W/ SUCTION  . Laparoscopic sigmoid colectomy  2004  . Repair tendon's and removal bone fragments, left elbow  2001  . Negative sleep study  2014  per pt  . Cardiovascular stress test  02-15-2009  DR SKAINS    normal perfusion study/  no evidence ischemia,  normal LV function and wall motion, ef 75%  . Transurethral resection of bladder tumor with gyrus (turbt-gyrus) N/A 04/21/2015    Procedure: TRANSURETHRAL RESECTION OF BLADDER TUMOR WITH GYRUS (TURBT-GYRUS);  Surgeon: Alexis Frock, MD;  Location: [facility];  Service: Urology;  Laterality: N/A;  . Cystoscopy with retrograde pyelogram, ureteroscopy and stent placement Right 04/21/2015    Procedure: CYSTOSCOPY WITH RIGHT  RETROGRADE PYELOGRAM, RIGHT DIAGNOSTIC  URETEROSCOPY AND STENT PLACEMENT;  Surgeon: Alexis Frock, MD;  [facility];  Service: Urology;  Laterality: Right;   Family History  Problem Relation Age of Onset  .  Hypertension Brother    History  Substance Use Topics  . Smoking status: Never Smoker   . Smokeless tobacco: Never Used  . Alcohol Use: No   OB History    No data available     Review of Systems  Constitutional: Negative for fever, chills and fatigue.  HENT: Negative for trouble swallowing.   Eyes: Negative for visual disturbance.  Respiratory:  Negative for shortness of breath.   Cardiovascular: Negative for chest pain and palpitations.  Gastrointestinal: Positive for nausea and vomiting. Negative for abdominal pain and diarrhea.  Genitourinary: Negative for dysuria and difficulty urinating.  Musculoskeletal: Negative for arthralgias and neck pain.  Skin: Negative for color change.  Neurological: Positive for headaches. Negative for dizziness and weakness.  Psychiatric/Behavioral: Negative for dysphoric mood.      Allergies  Hydrocodone-acetaminophen; Pseudoephedrine; Sulfa antibiotics; and Avelox  Home Medications   Prior to Admission medications   Medication Sig Start Date End Date Taking? Authorizing Provider  atorvastatin (LIPITOR) 10 MG tablet Take 10 mg by mouth every other day.   Yes Historical Provider, MD  beta carotene w/minerals (OCUVITE) tablet Take 1 tablet by mouth daily.   Yes Historical Provider, MD  butalbital-aspirin-caffeine [facility]) 50-325-40 MG per capsule Take 1 capsule by mouth every 4 (four) hours as needed. 01/20/15  Yes Historical Provider, MD  calcium-vitamin D (OSCAL WITH D) 250-125 MG-UNIT per tablet Take 1 tablet by mouth daily.   Yes Historical Provider, MD  FORTICAL 200 UNIT/ACT nasal spray Place 1 spray into alternate nostrils daily.  11/20/13  Yes Historical Provider, MD  hyoscyamine (LEVBID) 0.375 MG 12 hr tablet Take 0.375 mg by mouth every 12 (twelve) hours as needed for cramping.  03/04/15 03/03/16 Yes Historical Provider, MD  ibuprofen (ADVIL,MOTRIN) 200 MG tablet Take 200 mg by mouth every 6 (six) hours as needed for headache.   Yes Historical Provider, MD  Multiple Vitamins-Minerals (MULTIVITAMIN WITH MINERALS) tablet Take 1 tablet by mouth daily.   Yes Historical Provider, MD  olopatadine (PATANOL) 0.1 % ophthalmic solution Place 1 drop into both eyes 2 (two) times daily. 03/04/15  Yes Historical Provider, MD  ondansetron (ZOFRAN) 4 MG tablet Take 1 tablet (4 mg total) by mouth every 6 (six)  hours. 03/10/15  Yes Montine Circle, PA-C  oxybutynin (DITROPAN) 5 MG tablet Take 1 tablet by mouth every 8 (eight) hours as needed. 04/22/15  Yes Historical Provider, MD  oxyCODONE-acetaminophen (PERCOCET/ROXICET) 5-325 MG per tablet Take 1-2 tablets by mouth every 6 (six) hours as needed for moderate pain or severe pain. Post-operatively 05/13/15  Yes Alexis Frock, MD  senna-docusate (SENOKOT-S) 8.6-50 MG per tablet Take 1 tablet by mouth 2 (two) times daily. While taking pain meds to prevent constipation 05/13/15  Yes Alexis Frock, MD  traMADol (ULTRAM) 50 MG tablet Take 50 mg by mouth every 6 (six) hours as needed for moderate pain.   Yes Historical Provider, MD  vitamin C (ASCORBIC ACID) 500 MG tablet Take 500 mg by mouth daily.   Yes Historical Provider, MD  zolpidem (AMBIEN) 10 MG tablet Take 5 mg by mouth at bedtime.  11/24/13  Yes Historical Provider, MD  cephALEXin (KEFLEX) 500 MG capsule Take 1 capsule (500 mg total) by mouth 2 (two) times daily. X 3 days. Begin day prior to next Urology appointment. 05/13/15   Alexis Frock, MD   BP 120/68 mmHg  Pulse 57  Temp(Src) 97.8 F (36.6 C)  Resp 16  SpO2 98% Physical Exam  Constitutional: She is  oriented to person, place, and time. She appears well-developed and well-nourished. No distress.  HENT:  Head: Normocephalic and atraumatic.  Eyes: Conjunctivae and EOM are normal. Pupils are equal, round, and reactive to light.  Neck: Normal range of motion.  Cardiovascular: Normal rate and regular rhythm.  Exam reveals no gallop and no friction rub.   No murmur heard. Pulmonary/Chest: Effort normal and breath sounds normal. She has no wheezes. She has no rales. She exhibits no tenderness.  Abdominal: Soft. She exhibits no distension. There is no tenderness. There is no rebound.  Musculoskeletal: Normal range of motion.  Neurological: She is alert and oriented to person, place, and time. No cranial nerve deficit. Coordination normal.  Speech  is goal-oriented. Moves limbs without ataxia.   Skin: Skin is warm and dry.  Psychiatric: She has a normal mood and affect. Her behavior is normal.  Nursing note and vitals reviewed.   ED Course  Procedures (including critical care time) Labs Review Labs Reviewed - No data to display  Imaging Review Ct Head Wo Contrast  05/14/2015   CLINICAL DATA:  Migraine headaches. History of bladder cancer. Surgery today for kidney stones.  EXAM: CT HEAD WITHOUT CONTRAST  TECHNIQUE: Contiguous axial images were obtained from the base of the skull through the vertex without intravenous contrast.  COMPARISON:  MRI brain 03/31/2013.  CT maxillofacial 10/25/2011.  FINDINGS: Ventricles and sulci appear symmetrical. No mass effect or midline shift. No abnormal extra-axial fluid collections. Gray-white matter junctions are distinct. Basal cisterns are not effaced. No evidence of acute intracranial hemorrhage. No depressed skull fractures. Visualized paranasal sinuses and mastoid air cells are not opacified.  IMPRESSION: No acute intracranial abnormalities.   Electronically Signed   By: Lucienne Capers M.D.   On: 05/14/2015 02:18     EKG Interpretation None      MDM   Final diagnoses:  Other migraine without status migrainosus, not intractable    3:48 AM Head CT shows no acute changes. Patient has no neuro deficits. Patient reports improvement with migraine cocktail. Vitals stable and patient afebrile. No meningeal signs.     Alvina Chou, PA-C 05/14/15 1194  April Palumbo, MD 05/14/15 [phone]

## 2015-05-14 NOTE — ED Notes (Signed)
PA at bedside.

## 2015-05-14 NOTE — ED Notes (Signed)
Patient returned from CT

## 2015-05-14 NOTE — ED Notes (Signed)
Patient ambulatory to restroom  ?

## 2015-05-14 NOTE — ED Notes (Signed)
Patient transported to CT 

## 2015-05-14 NOTE — ED Notes (Signed)
Patient ambulated to restroom.

## 2015-05-14 NOTE — Op Note (Signed)
[name], [name] NO.:  [id number]  MEDICAL RECORD NO.:  [id number]  LOCATION:  WA17                         FACILITY:  [facility]  PHYSICIAN:  Alexis Frock, MD     DATE OF BIRTH:  1951/05/19  DATE OF PROCEDURE: 05/13/2015                              OPERATIVE REPORT   DIAGNOSIS:  Right renal stone.  PROCEDURES: 1. Cystoscopy with right retrograde pyelogram and interpretation. 2. Exchange of right ureteral stent, 5 x 22, Polaris, no tether. 3. Right ureteroscopy with basketing of stone.  ESTIMATED BLOOD LOSS:  Nil.  COMPLICATIONS:  None.  SPECIMENS:  Right lower pole renal stone for compositional analysis.  FINDINGS: 1. Relative narrowing of right proximal ureter without focal     stricture.  This was a much improved today versus last attempt     several weeks ago. 2. A solitary right lower pole kidney stone.  This was completely     removed with basketing technique. 3. Some old clot within the right kidney, it was felt that interval     stenting was clearly warranted to prevent clot colic. 4. Successful placement of right ureteral stent, proximal in upper     pole and distal in urinary bladder.  INDICATION:  [name] is a pleasant 64 year old lady with history of recurrent low-grade bladder cancer as well as right-sided nephrolithiasis.  She underwent resection of a bladder tumor last month and attempted removal of the right lower pole renal stone.  However, due to her very petite size and very petite ureter, ureteroscope was not able to successfully be navigated to the level of the stone.  As such, she underwent interval stenting to allow for passive dilation.  Options were discussed for the patient including observation of her stone versus re-attempt of ureteroscopic stone manipulation and she wished to proceed with the latter.  Informed consent was obtained and placed in the medical record.  PROCEDURE IN DETAIL:  The patient being [name], was verified. Procedure being cysto, retrograde right stent change and right ureteroscopic stone manipulation was confirmed.  Procedure was carried out.  Time-out was performed.  Intravenous antibiotics were administered.  General LMA anesthesia was introduced.  The patient was placed into a low lithotomy position and sterile field was created by prepping and draping the patient's vagina, introitus and proximal thighs using iodine x3.  Next, cystourethroscopy was performed using a 23- French rigid cystoscope with 30-degree offset lens.  Inspection of the urinary bladder revealed no diverticula, calcifications, papular lesions.  There were multiple old sites consistent with the prior tumor, but no active papillary disease.  The distal end of right ureteral stent was seen in situ.  This was grasped proximally at the level of the urethral meatus, through which, a 0.038 Zip wire was advanced at the level of upper pole and exchanged for an open-ended catheter and right retrograde pyelogram was obtained.  Right retrograde pyelogram demonstrated a single right ureter with single-system right kidney.  There was significant amount of filling defect within right renal pelvis consistent with likely blood clot.  The Zip wire was once again advanced and set aside as a safety wire.  An 8-  [place] feeding tube was placed in the urinary bladder for pressure release.  Next, semi-rigid ureteroscopy was performed at the distal fourth-fifths of the right ureter, alongside a separate Sensor working wire.  No mucosal abnormalities or calcifications were noted.  This was exchanged for a 24-French 12/14 ureteral access sheath using continuous fluoroscopic guidance to the level of the proximal ureter.  Next, flexible digital ureteroscopy was performed using a single-channel flexible digital ureteroscope, which allowed inspection of the proximal ureter and systematic inspection of the kidney x3.  As  anticipated, there was a moderate amount of blood clot within the renal pelvis and no evidence of perforation.  There was no active bleeding whatsoever.  Each calyx was inspected multiple times.  The single calcification was encountered as expected in lower pole calyx, was appeared to be amenable to simple basketing.  As such, an Escape-type basket was used to grasp the stone and was carefully navigated out in its entirety, and set aside for compositional analysis.  Given the formed blood clot within the renal pelvis, it was felt that interval stenting would be warranted to prevent clot colic.  As such, the access sheath was removed under continuous uteroscopic vision, no mucosal abnormalities were found, and a new 5 x 22 Polaris-type stent was placed over the remaining safety wire proximal in the upper pole, distal in the urinary bladder. Procedure was then terminated.  The patient tolerated the procedure well.  There were no immediate periprocedural complications.  The patient was taken to the postanesthesia care unit in stable condition.          ______________________________ Alexis Frock, MD     TM/MEDQ  D:  05/13/2015  T:  05/14/2015  Job:  [id number]

## 2015-05-14 NOTE — Op Note (Deleted)
[name], [name] NO.:  [id number]  MEDICAL RECORD NO.:  [id number]  LOCATION:  WA17                         FACILITY:  [facility]  PHYSICIAN:  Alexis Frock, MD     DATE OF BIRTH:  03-08-1951  DATE OF PROCEDURE: 05/13/2015                              OPERATIVE REPORT   DIAGNOSIS:  Right renal stone.  PROCEDURES: 1. Cystoscopy with right retrograde pyelogram and interpretation. 2. Exchange of right ureteral stent, 5 x 22, Polaris, no tether. 3. Right ureteroscopy with basketing of stone.  ESTIMATED BLOOD LOSS:  Nil.  COMPLICATIONS:  None.  SPECIMENS:  Right lower pole renal stone for compositional analysis.  FINDINGS: 1. Relative narrowing of right proximal ureter without focal     stricture.  This was a much improved today versus last attempt     several weeks ago. 2. A solitary right lower pole kidney stone.  This was completely     removed with basketing technique. 3. Some old clot within the right kidney, it was felt that interval     stenting was clearly warranted to prevent clot colic. 4. Successful placement of right ureteral stent, proximal in upper     pole and distal in urinary bladder.  INDICATION:  Ms. Casler is a pleasant 64 year old lady with history of recurrent low-grade bladder cancer as well as right-sided nephrolithiasis.  She underwent resection of a bladder tumor last month and attempted removal of the right lower pole renal stone.  However, due to her very petite size and very petite ureter, ureteroscope was not able to successfully be navigated to the level of the stone.  As such, she underwent interval stenting to allow for passive dilation.  Options were discussed for the patient including observation of her stone versus re-attempt of ureteroscopic stone manipulation and she wished to proceed with the latter.  Informed consent was obtained and placed in the medical record.  PROCEDURE IN DETAIL:  The patient being [name], was verified. Procedure being cysto, retrograde right stent change and right ureteroscopic stone manipulation was confirmed.  Procedure was carried out.  Time-out was performed.  Intravenous antibiotics were administered.  General LMA anesthesia was introduced.  The patient was placed into a low lithotomy position and sterile field was created by prepping and draping the patient's vagina, introitus and proximal thighs using iodine x3.  Next, cystourethroscopy was performed using a 23- French rigid cystoscope with 30-degree offset lens.  Inspection of the urinary bladder revealed no diverticula, calcifications, papular lesions.  There were multiple old sites consistent with the prior tumor, but no active papillary disease.  The distal end of right ureteral stent was seen in situ.  This was grasped proximally at the level of the urethral meatus, through which, a 0.038 Zip wire was advanced at the level of upper pole and exchanged for an open-ended catheter and right retrograde pyelogram was obtained.  Right retrograde pyelogram demonstrated a single right ureter with single-system right kidney.  There was significant amount of filling defect within right renal pelvis consistent with likely blood clot.  The Zip wire was once again advanced and set aside as a safety wire.  An 8-  [place] feeding tube was placed in the urinary bladder for pressure release.  Next, semi-rigid ureteroscopy was performed at the distal fourth-fifths of the right ureter, alongside a separate Sensor working wire.  No mucosal abnormalities or calcifications were noted.  This was exchanged for a 24-French 12/14 ureteral access sheath using continuous fluoroscopic guidance to the level of the proximal ureter.  Next, flexible digital ureteroscopy was performed using a single-channel flexible digital ureteroscope, which allowed inspection of the proximal ureter and systematic inspection of the kidney x3.  As  anticipated, there was a moderate amount of blood clot within the renal pelvis and no evidence of perforation.  There was no active bleeding whatsoever.  Each calyx was inspected multiple times.  The single calcification was encountered as expected in lower pole calyx, was appeared to be amenable to simple basketing.  As such, an Escape-type basket was used to grasp the stone and was carefully navigated out in its entirety, and set aside for compositional analysis.  Given the formed blood clot within the renal pelvis, it was felt that interval stenting would be warranted to prevent clot colic.  As such, the access sheath was removed under continuous uteroscopic vision, no mucosal abnormalities were found, and a new 5 x 22 Polaris-type stent was placed over the remaining safety wire proximal in the upper pole, distal in the urinary bladder. Procedure was then terminated.  The patient tolerated the procedure well.  There were no immediate periprocedural complications.  The patient was taken to the postanesthesia care unit in stable condition.          ______________________________ Alexis Frock, MD     TM/MEDQ  D:  05/13/2015  T:  05/14/2015  Job:  [id number]

## 2015-05-14 NOTE — ED Notes (Signed)
Patient c/o migraine with photophobia that started on Wednesday. Patient states she had a kidney procedure done on Thursday. Patient states she has taken a variety of pain medications at home without relief. Patient also c/o nausea and vomiting, last episode of emesis just PTA.

## 2015-05-17 ENCOUNTER — Encounter (HOSPITAL_BASED_OUTPATIENT_CLINIC_OR_DEPARTMENT_OTHER): Payer: Self-pay | Admitting: Urology

## 2015-06-14 DIAGNOSIS — J3089 Other allergic rhinitis: Secondary | ICD-10-CM | POA: Diagnosis not present

## 2015-06-14 DIAGNOSIS — J301 Allergic rhinitis due to pollen: Secondary | ICD-10-CM | POA: Diagnosis not present

## 2015-06-22 DIAGNOSIS — S86311A Strain of muscle(s) and tendon(s) of peroneal muscle group at lower leg level, right leg, initial encounter: Secondary | ICD-10-CM | POA: Diagnosis not present

## 2015-06-22 DIAGNOSIS — S93601A Unspecified sprain of right foot, initial encounter: Secondary | ICD-10-CM | POA: Diagnosis not present

## 2015-06-22 DIAGNOSIS — G5761 Lesion of plantar nerve, right lower limb: Secondary | ICD-10-CM | POA: Diagnosis not present

## 2015-06-24 DIAGNOSIS — J301 Allergic rhinitis due to pollen: Secondary | ICD-10-CM | POA: Diagnosis not present

## 2015-06-24 DIAGNOSIS — J3089 Other allergic rhinitis: Secondary | ICD-10-CM | POA: Diagnosis not present

## 2015-07-08 DIAGNOSIS — J3089 Other allergic rhinitis: Secondary | ICD-10-CM | POA: Diagnosis not present

## 2015-07-08 DIAGNOSIS — J301 Allergic rhinitis due to pollen: Secondary | ICD-10-CM | POA: Diagnosis not present

## 2015-07-12 DIAGNOSIS — K219 Gastro-esophageal reflux disease without esophagitis: Secondary | ICD-10-CM | POA: Diagnosis not present

## 2015-07-12 DIAGNOSIS — R05 Cough: Secondary | ICD-10-CM | POA: Diagnosis not present

## 2015-07-12 DIAGNOSIS — J301 Allergic rhinitis due to pollen: Secondary | ICD-10-CM | POA: Diagnosis not present

## 2015-07-12 DIAGNOSIS — J3089 Other allergic rhinitis: Secondary | ICD-10-CM | POA: Diagnosis not present

## 2015-07-14 DIAGNOSIS — J301 Allergic rhinitis due to pollen: Secondary | ICD-10-CM | POA: Diagnosis not present

## 2015-07-16 DIAGNOSIS — J3089 Other allergic rhinitis: Secondary | ICD-10-CM | POA: Diagnosis not present

## 2015-07-16 DIAGNOSIS — J301 Allergic rhinitis due to pollen: Secondary | ICD-10-CM | POA: Diagnosis not present

## 2015-07-19 DIAGNOSIS — Z23 Encounter for immunization: Secondary | ICD-10-CM | POA: Diagnosis not present

## 2015-07-19 DIAGNOSIS — J301 Allergic rhinitis due to pollen: Secondary | ICD-10-CM | POA: Diagnosis not present

## 2015-07-19 DIAGNOSIS — J3089 Other allergic rhinitis: Secondary | ICD-10-CM | POA: Diagnosis not present

## 2015-07-21 DIAGNOSIS — J301 Allergic rhinitis due to pollen: Secondary | ICD-10-CM | POA: Diagnosis not present

## 2015-07-21 DIAGNOSIS — J3089 Other allergic rhinitis: Secondary | ICD-10-CM | POA: Diagnosis not present

## 2015-07-23 DIAGNOSIS — J301 Allergic rhinitis due to pollen: Secondary | ICD-10-CM | POA: Diagnosis not present

## 2015-07-23 DIAGNOSIS — J3089 Other allergic rhinitis: Secondary | ICD-10-CM | POA: Diagnosis not present

## 2015-07-30 DIAGNOSIS — J3089 Other allergic rhinitis: Secondary | ICD-10-CM | POA: Diagnosis not present

## 2015-07-30 DIAGNOSIS — J301 Allergic rhinitis due to pollen: Secondary | ICD-10-CM | POA: Diagnosis not present

## 2015-08-09 DIAGNOSIS — J301 Allergic rhinitis due to pollen: Secondary | ICD-10-CM | POA: Diagnosis not present

## 2015-08-09 DIAGNOSIS — J3089 Other allergic rhinitis: Secondary | ICD-10-CM | POA: Diagnosis not present

## 2015-08-23 DIAGNOSIS — M25551 Pain in right hip: Secondary | ICD-10-CM | POA: Diagnosis not present

## 2015-08-23 DIAGNOSIS — J301 Allergic rhinitis due to pollen: Secondary | ICD-10-CM | POA: Diagnosis not present

## 2015-08-23 DIAGNOSIS — J3089 Other allergic rhinitis: Secondary | ICD-10-CM | POA: Diagnosis not present

## 2015-08-23 DIAGNOSIS — G5761 Lesion of plantar nerve, right lower limb: Secondary | ICD-10-CM | POA: Diagnosis not present

## 2015-08-30 DIAGNOSIS — C670 Malignant neoplasm of trigone of bladder: Secondary | ICD-10-CM | POA: Diagnosis not present

## 2015-08-30 DIAGNOSIS — R3915 Urgency of urination: Secondary | ICD-10-CM | POA: Diagnosis not present

## 2015-08-30 DIAGNOSIS — N200 Calculus of kidney: Secondary | ICD-10-CM | POA: Diagnosis not present

## 2015-09-02 DIAGNOSIS — G5761 Lesion of plantar nerve, right lower limb: Secondary | ICD-10-CM | POA: Diagnosis not present

## 2015-09-02 DIAGNOSIS — Z130 Encounter for screening for diseases of the blood and blood-forming organs and certain disorders involving the immune mechanism: Secondary | ICD-10-CM | POA: Diagnosis not present

## 2015-09-02 DIAGNOSIS — Z01419 Encounter for gynecological examination (general) (routine) without abnormal findings: Secondary | ICD-10-CM | POA: Diagnosis not present

## 2015-09-02 DIAGNOSIS — Z6821 Body mass index (BMI) 21.0-21.9, adult: Secondary | ICD-10-CM | POA: Diagnosis not present

## 2015-09-02 DIAGNOSIS — Z1231 Encounter for screening mammogram for malignant neoplasm of breast: Secondary | ICD-10-CM | POA: Diagnosis not present

## 2015-09-02 DIAGNOSIS — Z1389 Encounter for screening for other disorder: Secondary | ICD-10-CM | POA: Diagnosis not present

## 2015-09-07 DIAGNOSIS — J301 Allergic rhinitis due to pollen: Secondary | ICD-10-CM | POA: Diagnosis not present

## 2015-09-07 DIAGNOSIS — J3089 Other allergic rhinitis: Secondary | ICD-10-CM | POA: Diagnosis not present

## 2015-09-08 DIAGNOSIS — M25571 Pain in right ankle and joints of right foot: Secondary | ICD-10-CM | POA: Diagnosis not present

## 2015-09-13 DIAGNOSIS — M79671 Pain in right foot: Secondary | ICD-10-CM | POA: Diagnosis not present

## 2015-09-21 DIAGNOSIS — J3089 Other allergic rhinitis: Secondary | ICD-10-CM | POA: Diagnosis not present

## 2015-09-21 DIAGNOSIS — J301 Allergic rhinitis due to pollen: Secondary | ICD-10-CM | POA: Diagnosis not present

## 2015-10-06 DIAGNOSIS — J3089 Other allergic rhinitis: Secondary | ICD-10-CM | POA: Diagnosis not present

## 2015-10-06 DIAGNOSIS — J301 Allergic rhinitis due to pollen: Secondary | ICD-10-CM | POA: Diagnosis not present

## 2015-10-11 DIAGNOSIS — G5761 Lesion of plantar nerve, right lower limb: Secondary | ICD-10-CM | POA: Diagnosis not present

## 2015-10-11 DIAGNOSIS — M7062 Trochanteric bursitis, left hip: Secondary | ICD-10-CM | POA: Diagnosis not present

## 2015-10-11 DIAGNOSIS — G5762 Lesion of plantar nerve, left lower limb: Secondary | ICD-10-CM | POA: Diagnosis not present

## 2015-10-19 DIAGNOSIS — J301 Allergic rhinitis due to pollen: Secondary | ICD-10-CM | POA: Diagnosis not present

## 2015-10-19 DIAGNOSIS — J3089 Other allergic rhinitis: Secondary | ICD-10-CM | POA: Diagnosis not present

## 2015-10-28 DIAGNOSIS — E2839 Other primary ovarian failure: Secondary | ICD-10-CM | POA: Diagnosis not present

## 2015-10-28 DIAGNOSIS — M810 Age-related osteoporosis without current pathological fracture: Secondary | ICD-10-CM | POA: Diagnosis not present

## 2015-11-02 DIAGNOSIS — J301 Allergic rhinitis due to pollen: Secondary | ICD-10-CM | POA: Diagnosis not present

## 2015-11-02 DIAGNOSIS — J3089 Other allergic rhinitis: Secondary | ICD-10-CM | POA: Diagnosis not present

## 2015-11-22 ENCOUNTER — Ambulatory Visit (INDEPENDENT_AMBULATORY_CARE_PROVIDER_SITE_OTHER): Payer: PPO | Admitting: Internal Medicine

## 2015-11-22 ENCOUNTER — Encounter: Payer: Self-pay | Admitting: Internal Medicine

## 2015-11-22 VITALS — BP 102/60 | HR 63 | Temp 97.8°F | Resp 12 | Ht 63.5 in | Wt 123.0 lb

## 2015-11-22 DIAGNOSIS — M810 Age-related osteoporosis without current pathological fracture: Secondary | ICD-10-CM

## 2015-11-22 LAB — BASIC METABOLIC PANEL
BUN: 17 mg/dL (ref 6–23)
CALCIUM: 9.4 mg/dL (ref 8.4–10.5)
CO2: 28 mEq/L (ref 19–32)
Chloride: 104 mEq/L (ref 96–112)
Creatinine, Ser: 0.66 mg/dL (ref 0.40–1.20)
GFR: 95.23 mL/min (ref >60.00)
Glucose, Bld: 99 mg/dL (ref 70–99)
Potassium: 4.8 mEq/L (ref 3.5–5.1)
SODIUM: 138 meq/L (ref 135–145)

## 2015-11-22 LAB — VITAMIN D 25 HYDROXY (VIT D DEFICIENCY, FRACTURES): VITD: 47.39 ng/mL (ref 30.00–100.00)

## 2015-11-22 NOTE — Patient Instructions (Signed)
Please stop at the lab.  How Can I Prevent Falls? Men and women with osteoporosis need to take care not to fall down. Falls can break bones. Some reasons people fall are: Poor vision  Poor balance  Certain diseases that affect how you walk  Some types of medicine, such as sleeping pills.  Some tips to help prevent falls outdoors are: Use a cane or walker  Wear rubber-soled shoes so you don't slip  Walk on grass when sidewalks are slippery  In winter, put salt or kitty litter on icy sidewalks.  Some ways to help prevent falls indoors are: Keep rooms free of clutter, especially on floors  Use plastic or carpet runners on slippery floors  Wear low-heeled shoes that provide good support  Do not walk in socks, stockings, or slippers  Be sure carpets and area rugs have skid-proof backs or are tacked to the floor  Be sure stairs are well lit and have rails on both sides  Put grab bars on bathroom walls near tub, shower, and toilet  Use a rubber bath mat in the shower or tub  Keep a flashlight next to your bed  Use a sturdy step stool with a handrail and wide steps  Add more lights in rooms (and night lights) Buy a cordless phone to keep with you so that you don't have to rush to the phone       when it rings and so that you can call for help if you fall.   (adapted from http://www.niams.NightlifePreviews.se)  Dietary sources of calcium and vitamin D:  Calcium content (mg) - http://www.niams.MoviePins.co.za  Fortified oatmeal, 1 packet 350  Sardines, canned in oil, with edible bones, 3 oz. 324  Cheddar cheese, 1 oz. shredded 306  Milk, nonfat, 1 cup 302  Milkshake, 1 cup 300  Yogurt, plain, low-fat, 1 cup 300  Soybeans, cooked, 1 cup 261  Tofu, firm, with calcium,  cup 204  Orange juice, fortified with calcium, 6 oz. 200-260 (varies)  Salmon, canned, with edible bones, 3 oz. 181  Pudding, instant, made with 2% milk,  cup  153  Baked beans, 1 cup [place], 1% milk fat, 1 cup 138  Spaghetti, lasagna, 1 cup 125  Frozen yogurt, vanilla, soft-serve,  cup 103  Ready-to-eat cereal, fortified with calcium, 1 cup 100-1,000 (varies)  Cheese pizza, 1 slice 173  Fortified waffles, 2 100  Turnip greens, boiled,  cup 99  Broccoli, raw, 1 cup 90  Ice cream, vanilla,  cup 85  Soy or rice milk, fortified with calcium, 1 cup 80-500 (varies)   Vitamin D content (International Units, IU) - https://www.ars.usda.gov Cod liver oil, 1 tablespoon 1,360  Swordfish, cooked, 3 oz 566  Salmon (sockeye), cooked, 3 oz 447  Tuna fish, canned in water, drained, 3 oz 154  Orange juice fortified with vitamin D, 1 cup (check product labels, as amount of added vitamin D varies) 137  Milk, nonfat, reduced fat, and whole, vitamin D-fortified, 1 cup 115-124  Yogurt, fortified with 20% of the daily value for vitamin D, 6 oz 80  Margarine, fortified, 1 tablespoon 60  Sardines, canned in oil, drained, 2 sardines 46  Liver, beef, cooked, 3 oz 42  Egg, 1 large (vitamin D is found in yolk) 41  Ready-to-eat cereal, fortified with 10% of the daily value for vitamin D, 0.75-1 cup  40  Cheese, Swiss, 1 oz 6   Exercise for Strong Bones (from [place]) There are two  types of exercises that are important for building and maintaining bone density:  weight-bearing and muscle-strengthening exercises. Weight-bearing Exercises These exercises include activities that make you move against gravity while staying upright. Weight-bearing exercises can be high-impact or low-impact. High-impact weight-bearing exercises help build bones and keep them strong. If you have broken a bone due to osteoporosis or are at risk of breaking a bone, you may need to avoid high-impact exercises. If youre not sure, you should check with your healthcare provider. Examples of high-impact weight-bearing exercises are:  Dancing  Doing  high-impact aerobics  Hiking  Jogging/running  Jumping Rope  Stair climbing  Tennis Low-impact weight-bearing exercises can also help keep bones strong and are a safe alternative if you cannot do high-impact exercises. Examples of low-impact weight-bearing exercises are:  Using elliptical training machines  Doing low-impact aerobics  Using stair-step machines  Fast walking on a treadmill or outside Muscle-Strengthening Exercises These exercises include activities where you move your body, a weight or some other resistance against gravity. They are also known as resistance exercises and include:  Lifting weights  Using elastic exercise bands  Using weight machines  Lifting your own body weight  Functional movements, such as standing and rising up on your toes Yoga and Pilates can also improve strength, balance and flexibility. However, certain positions may not be safe for people with osteoporosis or those at increased risk of broken bones. For example, exercises that have you bend forward may increase the chance of breaking a bone in the spine. A physical therapist should be able to help you learn which exercises are safe and appropriate for you. Non-Impact Exercises Non-impact exercises can help you to improve balance, posture and how well you move in everyday activities. These exercises can also help to increase muscle strength and decrease the risk of falls and broken bones. Some of these exercises include:  Balance exercises that strengthen your legs and test your balance, such as Tai Chi, can decrease your risk of falls.  Posture exercises that improve your posture and reduce rounded or sloping shoulders can help you decrease the chance of breaking a bone, especially in the spine.  Functional exercises that improve how well you move can help you with everyday activities and decrease your chance of falling and breaking a bone. For example, if you have trouble getting up  from a chair or climbing stairs, you should do these activities as exercises. A physical therapist can teach you balance, posture and functional exercises. Starting a New Exercise Program If you havent exercised regularly for a while, check with your healthcare provider before beginning a new exercise program--particularly if you have health problems such as heart disease, diabetes or high blood pressure. If youre at high risk of breaking a bone, you should work with a physical therapist to develop a safe exercise program. Once you have your healthcare providers approval, start slowly. If youve already broken bones in the spine because of osteoporosis, be very careful to avoid activities that require reaching down, bending forward, rapid twisting motions, heavy lifting and those that increase your chance of a fall. As you get started, your muscles may feel sore for a day or two after you exercise. If soreness lasts longer, you may be working too hard and need to ease up. Exercises should be done in a pain-free range of motion. How Much Exercise Do You Need? Weight-bearing exercises 30 minutes on most days of the week. Do a 30-minutesession or multiple sessions spread out  throughout the day. The benefits to your bones are the same.   Muscle-strengthening exercises Two to three days per week. If you dont have much time for strengthening/resistance training, do small amounts at a time. You can do just one body part each day. For example do arms one day, legs the next and trunk the next. You can also spread these exercises out during your normal day.  Balance, posture and functional exercises Every day or as often as needed. You may want to focus on one area more than the others. If you have fallen or lose your balance, spend time doing balance exercises. If you are getting rounded shoulders, work more on posture exercises. If you have trouble climbing stairs or getting up from the couch, do more  functional exercises. You can also perform these exercises at one time or spread them during your day. Work with a phyiscal therapist to learn the right exercises for you.    Denosumab: Patient drug information (Up-to-date) Copyright 570 692 [id number] [place] rights reserved.  Brand Names: [place]  ProliaDelton See What is this drug used for?  It is used to treat soft, brittle bones (osteoporosis).  It is used for bone growth.  It is used when treating some cancers.  It may be given to you for other reasons. Talk with the doctor. What do I need to tell my doctor BEFORE I take this drug?  All products:  If you have an allergy to denosumab or any other part of this drug.  If you are allergic to any drugs like this one, any other drugs, foods, or other substances. Tell your doctor about the allergy and what signs you had, like rash; hives; itching; shortness of breath; wheezing; cough; swelling of face, lips, tongue, or throat; or any other signs.  If you have low calcium levels.  Prolia:  If you are pregnant or may be pregnant. Do not take this drug if you are pregnant.  This is not a list of all drugs or health problems that interact with this drug.  Tell your doctor and pharmacist about all of your drugs (prescription or OTC, natural products, vitamins) and health problems. You must check to make sure that it is safe for you to take this drug with all of your drugs and health problems. Do not start, stop, or change the dose of any drug without checking with your doctor. What are some things I need to know or do while I take this drug?  All products:  Tell dentists, surgeons, and other doctors that you use this drug.  This drug may raise the chance of a broken leg. Talk with your doctor.  Have your blood work checked. Talk with your doctor.  Have a bone density test. Talk with your doctor.  Take calcium and vitamin D as you were told by your doctor.  Have a dental exam before  starting this drug.  Take good care of your teeth. See a dentist often.  If you smoke, talk with your doctor.  Do not give to a child. Talk with your doctor.  Tell your doctor if you are breast-feeding. You will need to talk about any risks to your baby.  Delton See:  This drug may cause harm to the unborn baby if you take it while you are pregnant. If you get pregnant while taking this drug, call your doctor right away.  Prolia:  Very bad infections have been reported with use of this drug. If you  have any infection, are taking antibiotics now or in the recent past, or have many infections, talk with your doctor.  You may have more chance of getting an infection. Wash hands often. Stay away from people with infections, colds, or flu.  Use birth control that you can trust to prevent pregnancy while taking this drug.  If you are a man and your sex partner is pregnant or gets pregnant at any time while you are being treated, talk with your doctor. What are some side effects that I need to call my doctor about right away?  WARNING/CAUTION: Even though it may be rare, some people may have very bad and sometimes deadly side effects when taking a drug. Tell your doctor or get medical help right away if you have any of the following signs or symptoms that may be related to a very bad side effect:  All products:  Signs of an allergic reaction, like rash; hives; itching; red, swollen, blistered, or peeling skin with or without fever; wheezing; tightness in the chest or throat; trouble breathing or talking; unusual hoarseness; or swelling of the mouth, face, lips, tongue, or throat.  Signs of low calcium levels like muscle cramps or spasms, numbness and tingling, or seizures.  Mouth sores.  Any new or strange groin, hip, or thigh pain.  This drug may cause jawbone problems. The chance may be higher the longer you take this drug. The chance may be higher if you have cancer, dental problems,  dentures that do not fit well, anemia, blood clotting problems, or an infection. The chance may also be higher if you are having dental work or if you are getting chemo, some steroid drugs, or radiation. Call your doctor right away if you have jaw swelling or pain.  Xgeva:  Not hungry.  Muscle pain or weakness.  Seizures.  Shortness of breath.  Prolia:  Signs of infection. These include a fever of 100.61F (38C) or higher, chills, very bad sore throat, ear or sinus pain, cough, more sputum or change in color of sputum, pain with passing urine, mouth sores, wound that will not heal, or anal itching or pain.  Signs of a pancreas problem (pancreatitis) like very bad stomach pain, very bad back pain, or very bad upset stomach or throwing up.  Chest pain.  A heartbeat that does not feel normal.  Very bad skin irritation.  Feeling very tired or weak.  Bladder pain or pain when passing urine or change in how much urine is passed.  Passing urine often.  Swelling in the arms or legs. What are some other side effects of this drug?  All drugs may cause side effects. However, many people have no side effects or only have minor side effects. Call your doctor or get medical help if any of these side effects or any other side effects bother you or do not go away:  Xgeva:  Feeling tired or weak.  Headache.  Upset stomach or throwing up.  Loose stools (diarrhea).  Cough.  Prolia:  Back pain.  Muscle or joint pain.  Sore throat.  Runny nose.  Pain in arms or legs.  These are not all of the side effects that may occur. If you have questions about side effects, call your doctor. Call your doctor for medical advice about side effects.  You may report side effects to your national health agency. How is this drug best taken?  Use this drug as ordered by your doctor. Read and follow the  dosing on the label closely.  It is given as a shot into the fatty part of the skin. What do  I do if I miss a dose?  Call the doctor to find out what to do. How do I store and/or throw out this drug?  This drug will be given to you in a hospital or doctor's office. You will not store it at home.  Keep all drugs out of the reach of children and pets.  Check with your pharmacist about how to throw out unused drugs.  General drug facts  If your symptoms or health problems do not get better or if they become worse, call your doctor.  Do not share your drugs with others and do not take anyone else's drugs.  Keep a list of all your drugs (prescription, natural products, vitamins, OTC) with you. Give this list to your doctor.  Talk with the doctor before starting any new drug, including prescription or OTC, natural products, or vitamins.  Some drugs may have another patient information leaflet. If you have any questions about this drug, please talk with your doctor, pharmacist, or other health care provider.  If you think there has been an overdose, call your poison control center or get medical care right away. Be ready to tell or show what was taken, how much, and when it happened.  Please return in 1 year.

## 2015-11-22 NOTE — Progress Notes (Signed)
Patient ID: Rebekah [name], female   DOB: June 26, 1951, 65 y.o.   MRN: [id number]   HPI  Rebekah [name] is a 65 y.o.-year-old female-year-old female, referred by her PCP, Rebekah.Wolters, for management of osteoporosis.  Pt was dx with OP in ~2006 No dizziness/vertigo/orthostasis.  I reviewed pt's DEXA scans: Date L1-L4 T score FN T score   10/28/2015 ([name])   -2.8  LFN -1.5, RFN -1.6   09/05/2013  -2.6  LFN -1.7   She has been on the following OP treatments:  -  Fosamax from 2007-2012 with apparently no improvement in her DEXA scores -  Forteo for 2 years from 04/2011-04/2013 -  Miacalcin from 09/2013 to present  Many ractures: - L elbow  (2 surgeries - pins) - fell down steps - both feet (one foot x2 - pins) - running, playing soccer - both wrists several times - playing soccer, falling off bike - 6 fingers - all at the same time - rope swing - 2014: R 4th and 5th toes - 2015: R toe - 07/2015: R big toe - tripped  No h/o vitamin D deficiency. Last vit D level was  43 in  04/2015. Pt is on calcium and vitamin D. She also eats dairy and green, leafy, vegetables.  She takes 2x Ca citrate + D TABs and 1 TAB at night : 945 mg of calcium and 750 units of vitamin D. She also takes MVI: 500 mg Calcium 1000 units vit D.  She has had 5-6 steroid inj over the years. She sees Rebekah [name].  No weight bearing exercises.  She does many other exercises. Goes to the Rebekah [name] usually, not recently.   She has a history of [name] after she hurt her [name] on 10/2014.  She does not take high vitamin A doses.  No h/o hyper/hypocalcemia. No h/o hyperparathyroidism.  Lab Results  Component Value Date   CALCIUM 9.9 03/10/2015   CALCIUM 9.9 03/31/2014   CALCIUM 9.5 12/01/2013   CALCIUM 10.0 11/30/2013   She has a history of nephrolithiasis.  No h/o thyrotoxicosis.   No h/o CKD. Last BUN/Cr: Lab Results  Component Value Date   BUN 22* 05/13/2015   CREATININE 0.70 05/13/2015   Menopause was at 65 y/o.   Pt does have  a FH of osteoporosis: GM.  She has a h/o bladder Ca.  ROS: Constitutional: no weight gain/loss, no fatigue, no subjective hyperthermia/hypothermia, + poor sleep, + nocturia Eyes: no blurry vision, no xerophthalmia ENT: no sore throat, no nodules palpated in throat, no dysphagia/odynophagia, no hoarseness Cardiovascular: no CP/SOB/palpitations/leg swelling Respiratory: no cough/SOB Gastrointestinal: no N/V/D/C Musculoskeletal: no muscle/+ joint aches Skin: no rashes Neurological: no tremors/numbness/tingling/dizziness, + HA Psychiatric: no depression/anxiety  Past Medical History  Diagnosis Date  . Arthritis     LUMBAR SPINE / [name]  . Hyperlipidemia   . Migraine   . History of diverticulitis of colon     s/p  sigmoid colectomy  . History of hiatal hernia   . Nephrolithiasis     bilateral  . PONV (postoperative nausea and vomiting)   . Bladder cancer ([name])     recurrent  . Seasonal allergies   . Lower urinary tract symptoms (LUTS)   . Wears glasses    Past Surgical History  Procedure Laterality Date  . C-sections  1983  . Orif left elbow  2000  . Orif left foot  2012  . Blepharoplasty - bilateral    . Transurethral resection of bladder tumor with  Rebekah (turbt-Rebekah) N/A 06/19/2013    Procedure: TRANSURETHRAL RESECTION OF BLADDER TUMOR WITH Rebekah (TURBT-Rebekah);  Surgeon: Rebekah Hazard, MD;  Location: [facility];  Service: Urology;  Laterality: N/A;  . Cystoscopy w/ retrogrades Bilateral 06/19/2013    Procedure: CYSTOSCOPY WITH RETROGRADE PYELOGRAM;  Surgeon: Rebekah Hazard, MD;  Location: [facility];  Service: Urology;  Laterality: Bilateral;  . Esophagogastroduodenoscopy N/A 08/24/2014    Procedure: ESOPHAGOGASTRODUODENOSCOPY (EGD);  Surgeon: Rebekah Ng, MD;  [name];  Service: Endoscopy;  Laterality: N/A;  . Bravo ph study N/A 08/24/2014    Procedure: BRAVO [place];  Surgeon: Rebekah Ng, MD;  Location: [facility];  Service:  Endoscopy;  Laterality: N/A;  . Shoulder arthroscopy Left   . Dilation and curettage of uterus  1982    W/ SUCTION  . Laparoscopic sigmoid colectomy  2004  . Repair tendon's and removal bone fragments, left elbow  2001  . Negative sleep study  2014  per pt  . Cardiovascular stress test  02-15-2009  Rebekah [name]    normal perfusion study/  no evidence ischemia,  normal LV function and wall motion, ef 75%  . Transurethral resection of bladder tumor with Rebekah (turbt-Rebekah) N/A 04/21/2015    Procedure: TRANSURETHRAL RESECTION OF BLADDER TUMOR WITH Rebekah (TURBT-Rebekah);  Surgeon: Rebekah Frock, MD;  Location: [name];  Service: Urology;  Laterality: N/A;  . Cystoscopy with retrograde pyelogram, ureteroscopy and stent placement Right 04/21/2015    Procedure: CYSTOSCOPY WITH RIGHT  RETROGRADE PYELOGRAM, RIGHT DIAGNOSTIC  URETEROSCOPY AND STENT PLACEMENT;  Surgeon: Rebekah Frock, MD;  Location[facility];  Service: Urology;  Laterality: Right;  . Cystoscopy with retrograde pyelogram, ureteroscopy and stent placement Right 05/13/2015    Procedure: CYSTOSCOPY WITH RETROGRADE PYELOGRAM, URETEROSCOPY AND STENT EXCHANGE;  Surgeon: Rebekah Frock, MD;  Location: [facility];  Service: Urology;  Laterality: Right;  . Stone extraction with basket Right 05/13/2015    Procedure: STONE EXTRACTION WITH BASKET;  Surgeon: Rebekah Frock, MD;  Location[facility];  Service: Urology;  Laterality: Right;   Social History   Social History  . Marital Status: Married    Spouse Name: N/A  . Number of Children: 1   Occupational History  . retired   Social History Main Topics  . Smoking status: Never Smoker   . Smokeless tobacco: Never Used  . Alcohol Use: No  . Drug Use: No   Current Outpatient Prescriptions on File Prior to Visit  Medication Sig Dispense Refill  . calcium-vitamin D (OSCAL WITH D) 250-125 MG-UNIT per tablet Take 1 tablet by mouth  daily.    Pershing Cox 200 UNIT/ACT nasal spray Place 1 spray into alternate nostrils daily.     . Multiple Vitamins-Minerals (MULTIVITAMIN WITH MINERALS) tablet Take 1 tablet by mouth daily.    Marland Kitchen zolpidem (AMBIEN) 10 MG tablet Take 5 mg by mouth at bedtime.     . butalbital-aspirin-caffeine (FIORINAL) 50-325-40 MG per capsule Take 1 capsule by mouth every 4 (four) hours as needed. Reported on 11/22/2015    . ibuprofen (ADVIL,MOTRIN) 200 MG tablet Take 200 mg by mouth every 6 (six) hours as needed for headache. Reported on 11/22/2015     No current facility-administered medications on file prior to visit.   Allergies  Allergen Reactions  . Hydrocodone-Acetaminophen Other (See Comments)    REACTION: nightmares  . Pseudoephedrine Other (See Comments)    REACTION: makes heart race  . Sulfa Antibiotics  Hives  . Avelox [Moxifloxacin] Rash   Family History  Problem Relation Age of Onset  . Hypertension Brother   . Diabetes Brother   . Hyperlipidemia Brother   . Heart disease Brother   . Cancer Mother   . Heart disease Father   . Cancer Sister    PE: BP 102/60 mmHg  Pulse 63  Temp(Src) 97.8 F (36.6 C) (Oral)  Resp 12  Ht 5' 3.5" (1.613 m)  Wt 123 lb (55.792 kg)  BMI 21.44 kg/m2  SpO2 97% Wt Readings from Last 3 Encounters:  11/22/15 123 lb (55.792 kg)  05/13/15 112 lb (50.803 kg)  04/21/15 116 lb (52.617 kg)   Constitutional:  Normal weight, in NAD. No kyphosis. Eyes: PERRLA, EOMI, no exophthalmos ENT: moist mucous membranes, no thyromegaly, no cervical lymphadenopathy Cardiovascular: RRR, No MRG Respiratory: CTA B Gastrointestinal: abdomen soft, NT, ND, BS+ Musculoskeletal: no deformities, strength intact in all 4 Skin: moist, warm, no rashes Neurological: no tremor with outstretched hands, DTR normal in all 4  Assessment: 1. Osteoporosis  Plan: 1. Osteoporosis - likely postmenopausal, but with atypical features >> many small bone fractures;  Resistant to  teriparatide - Discussed about increased risk of fracture, depending on the T score, greatly increased when the T score is lower than -2.5, but it is actually a continuum and -2.5 should not be regarded as an absolute threshold. We reviewed her DEXA scans together, and I explained that based on the T scores, she has an increased risk for fractures.  - We discussed about the different medication classes, benefits and side effects (including atypical fractures and ONJ - no dental workup in progress or planned).  -since she was already on oral bisphosphonates and on Teriparatide (Forteo), I suggested to try sq denosumab (Prolia). Pt was given reading information about these, and I explained the mechanism of action and expected benefits.  - we reviewed her dietary and supplemental calcium and vitamin D intake, which I believe are adequate.  I advised her to continue this - given her specific instructions about food sources for these - see pt instructions  - discussed fall precautions   - given handout from [place] Re: weight bearing exercises - advised to do this every day or at least 5/7 days - we discuss a steroid injections can cause more problems with osteoporosis and fractures and I advised her to stay away from them, if possible. -  No smoking, no alcohol - We will check the following tests:  Vitamin D  BMP  PTH - if all normal,  We will start the preauthorization for Prolia.  She will let me know if she decides for this. - She has pain in her left hip for many years, and she will see orthophoric this next month. She may need an x-ray of her hip.  After she has this evaluation, she will let me know, and we can start Prolia. -  For now we'll continue nasal calcitonin, although I do not thing that she gets much benefit from this. We will stop this always start Prolia. - will see pt [name] in a year - she appears to have an active MyChart but refuses to use it >> would like  to be called with results   Office Visit on 11/22/2015  Component Date Value Ref Range Status  . Sodium 11/22/2015 138  135 - 145 mEq/L Final  . Potassium 11/22/2015 4.8  3.5 - 5.1 mEq/L Final  . Chloride 11/22/2015 104  96 - 112 mEq/L Final  . CO2 11/22/2015 28  19 - 32 mEq/L Final  . Glucose, Bld 11/22/2015 99  70 - 99 mg/dL Final  . BUN 11/22/2015 17  6 - 23 mg/dL Final  . Creatinine, Ser 11/22/2015 0.66  0.40 - 1.20 mg/dL Final  . Calcium 11/22/2015 9.4  8.4 - 10.5 mg/dL Final  . GFR 11/22/2015 95.23  >60.00 mL/min Final  . PTH 11/22/2015 27  15 - 65 pg/mL Final  . VITD 11/22/2015 47.39  30.00 - 100.00 Wyatt/mL Final   Excellent labs!

## 2015-11-23 LAB — PARATHYROID HORMONE, INTACT (NO CA): PTH: 27 pg/mL (ref 15–65)

## 2015-11-25 ENCOUNTER — Telehealth: Payer: Self-pay | Admitting: *Deleted

## 2015-11-25 NOTE — Telephone Encounter (Signed)
Rose, will you initiate a PA for Prolia for this pt, please? Thank you ahead of time!

## 2015-12-03 NOTE — Telephone Encounter (Signed)
I have electronically submitted pt's info for Prolia insurance verification and will notify you once I have a response. Thank you. °

## 2015-12-14 ENCOUNTER — Telehealth: Payer: Self-pay | Admitting: Internal Medicine

## 2015-12-14 NOTE — Telephone Encounter (Signed)
Faxed form back to [facility].

## 2015-12-14 NOTE — Telephone Encounter (Signed)
I have rec'd Ms. Peeters's insurance verification for Prolia, however, [employer] is requiring a prior authorization.  Please either answer questions #1-3 or ask Dr. Cruzita Lederer to answer, then she needs to sign the bottom.  Once complete please return to my attn via fax [phone].  I have faxed them to your attn at [phone].  Thank you.

## 2015-12-14 NOTE — Telephone Encounter (Signed)
Done

## 2015-12-14 NOTE — Telephone Encounter (Signed)
Form will be on your desk. Thank you.

## 2015-12-15 NOTE — Telephone Encounter (Signed)
Opened in error

## 2015-12-15 NOTE — Telephone Encounter (Signed)
Faxed completed p/a form along w/office notes from 11/22/2015 to [facility].  I'll let you know as soon as I have a response. Thank you.

## 2016-01-03 NOTE — Telephone Encounter (Signed)
I called [employer] to check on Ms. Licciardi's prior auth for Prolia.  I have not rec'd a letter, but Lucita Ferrara. @ [place] says it is authorized, Josem Kaufmann [id number], effective 12/15/2015-10/29/2016.  Ms. Antkowiak's estimated responsibility is $20.  Please make her aware this is an estimate and we will not know an exact amt until insurance(s) has/have paid.  I have sent a copy of the summary of benefits to be scanned into pt's chart.    Once pt recs injection, please let me know actual injection date so I can update the Prolia portal.  If you have any questions, please let me know.  Thank you.

## 2016-01-07 NOTE — Telephone Encounter (Signed)
Called pt and lvm advising her per message below. Asked pt to return call and schedule her Prolia inj.

## 2016-01-20 ENCOUNTER — Telehealth: Payer: Self-pay | Admitting: Internal Medicine

## 2016-01-20 NOTE — Telephone Encounter (Signed)
[name],   This is your patient. 

## 2016-01-20 NOTE — Telephone Encounter (Signed)
Pt has questions about the Prolia injection

## 2016-02-01 NOTE — Telephone Encounter (Signed)
Lvm 2 times for pt to return call.

## 2016-02-28 HISTORY — PX: RESECTION BONE TUMOR FOOT: SHX902

## 2016-03-29 ENCOUNTER — Other Ambulatory Visit: Payer: Self-pay | Admitting: Orthopedic Surgery

## 2016-09-29 ENCOUNTER — Telehealth: Payer: Self-pay

## 2016-09-29 NOTE — Telephone Encounter (Signed)
I contacted the patient and left a voicemail requesting the patient to call back and advise if she is currently taking the prolia injection and when her last injection was.

## 2016-11-21 ENCOUNTER — Ambulatory Visit: Payer: BC Managed Care – PPO | Admitting: Internal Medicine

## 2016-11-23 IMAGING — CT CT ABD-PELV W/O CM
2 series · 12 of 40 positions shown, 14 images · non-contrast
Comparison: [facility] 12/02/2013 and earlier. CT Abdomen and Pelvis
12/01/2013.

CLINICAL DATA: 64-year-old female with flank and lower back pain
since this morning. Initial encounter.

EXAM:
CT ABDOMEN AND PELVIS WITHOUT CONTRAST
TECHNIQUE: Multidetector CT imaging of the abdomen and pelvis was performed
following the standard protocol without IV contrast.

[Series 5: coronal · coronal · 0.74mm/px · 11 of 78 slices shown, 13 images]
[im 6/78  lung]
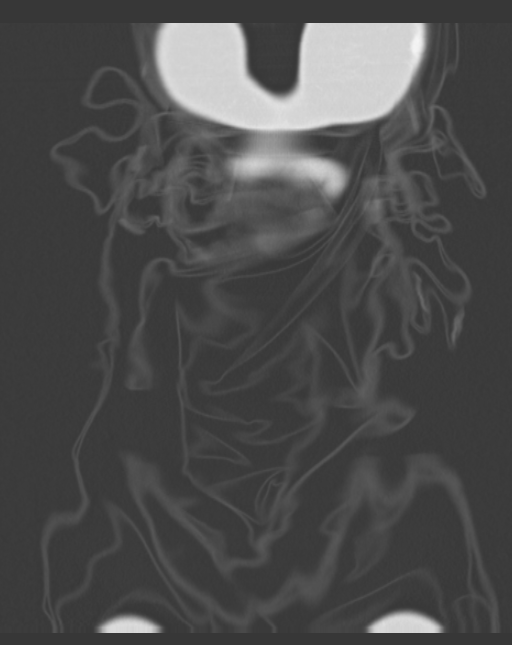
[im 9/78  soft-tissue]
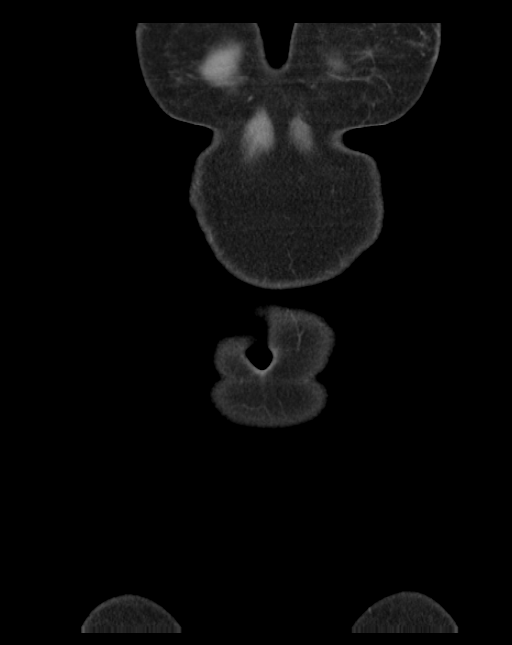
[im 9/78  bone]
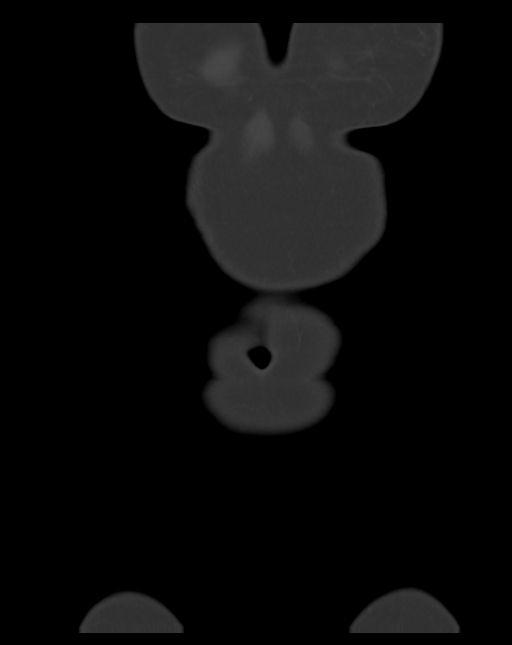
[im 12/78  lung]
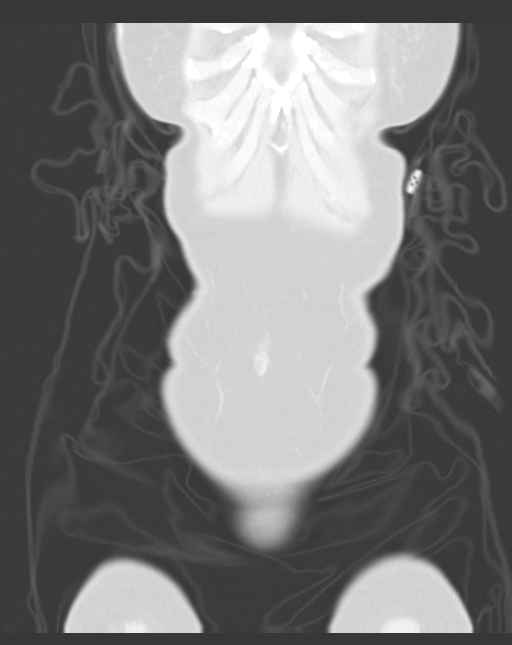
[im 18/78  soft-tissue]
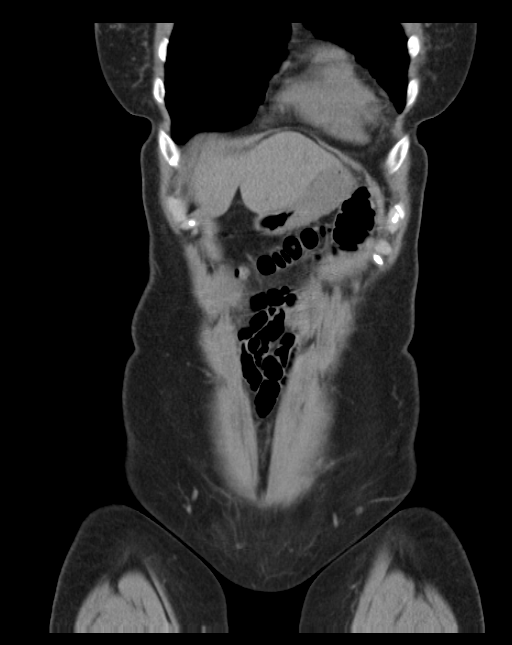
[im 18/78  lung]
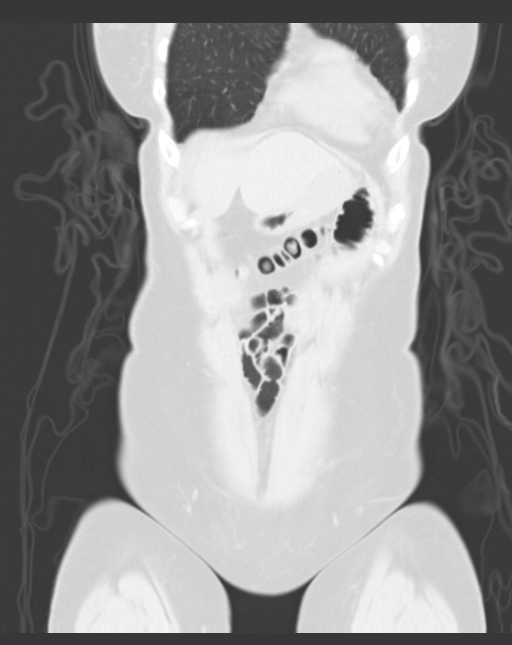
[im 24/78  lung]
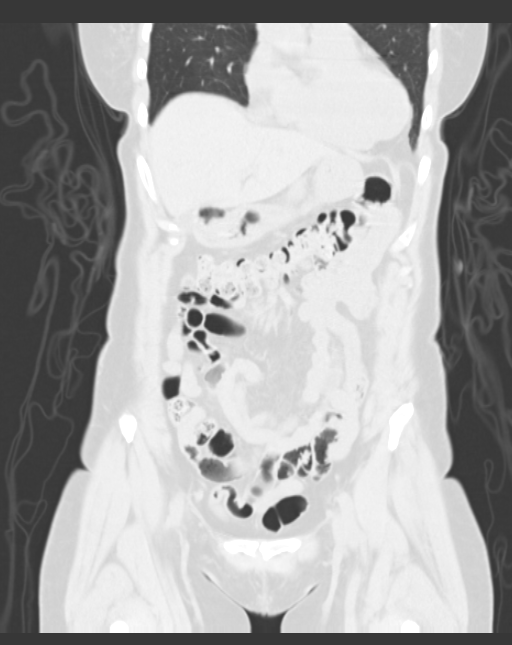
[im 26/78  soft-tissue]
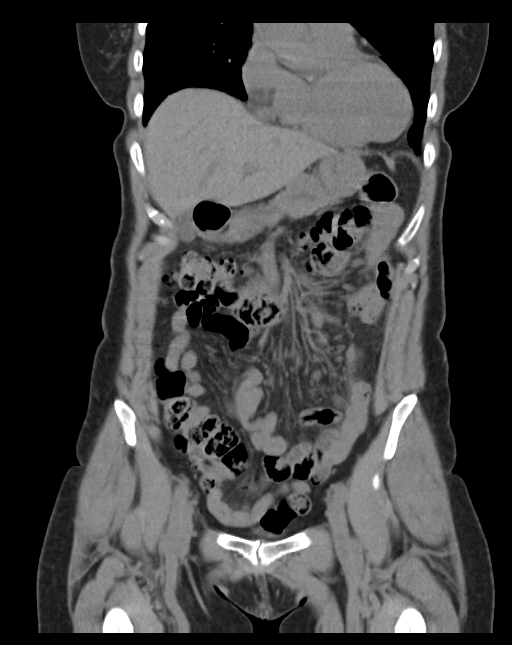
[im 35/78  soft-tissue]
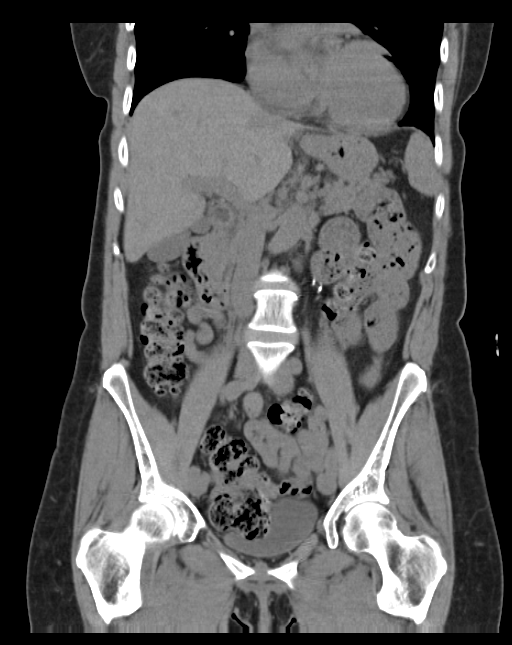
[im 43/78  soft-tissue]
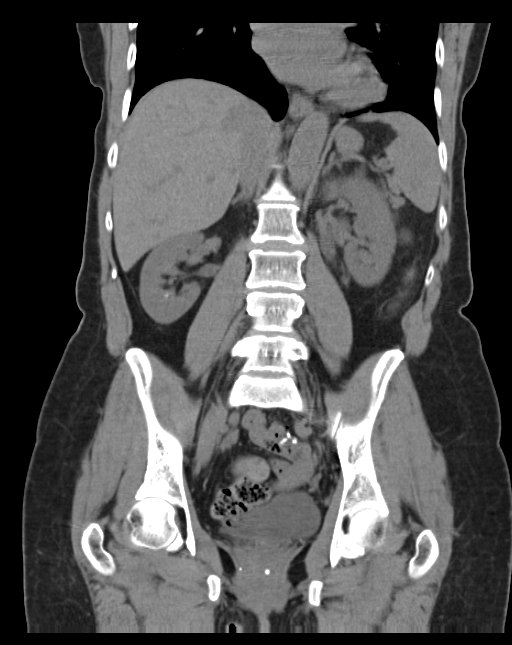
[im 52/78  soft-tissue]
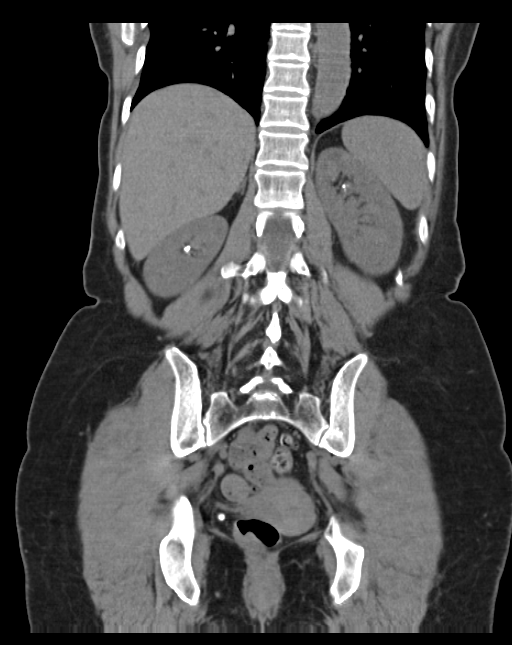
[im 60/78  soft-tissue]
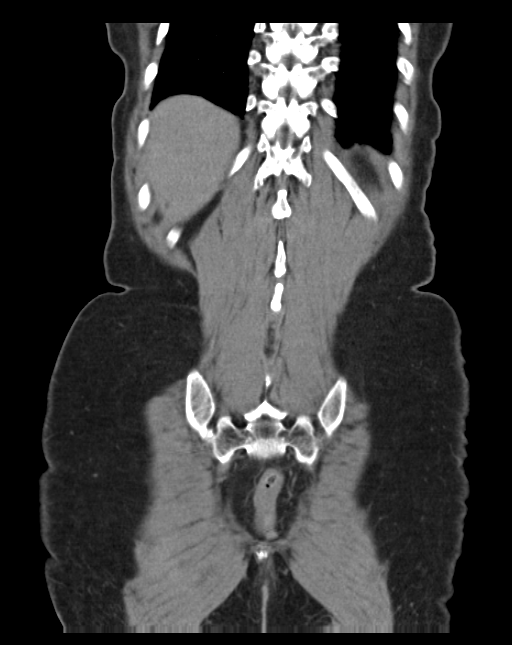
[im 69/78  soft-tissue]
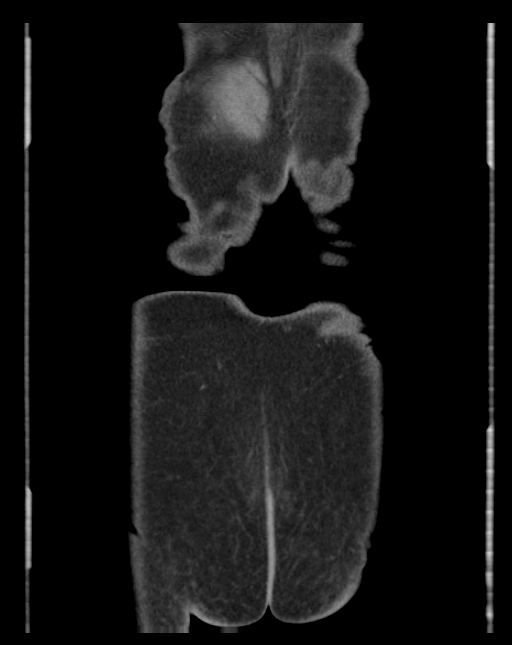

[Series 6: sagittal · sagittal · 0.51mm/px · 1 of 106 slices shown]
[im 50/106  soft-tissue]
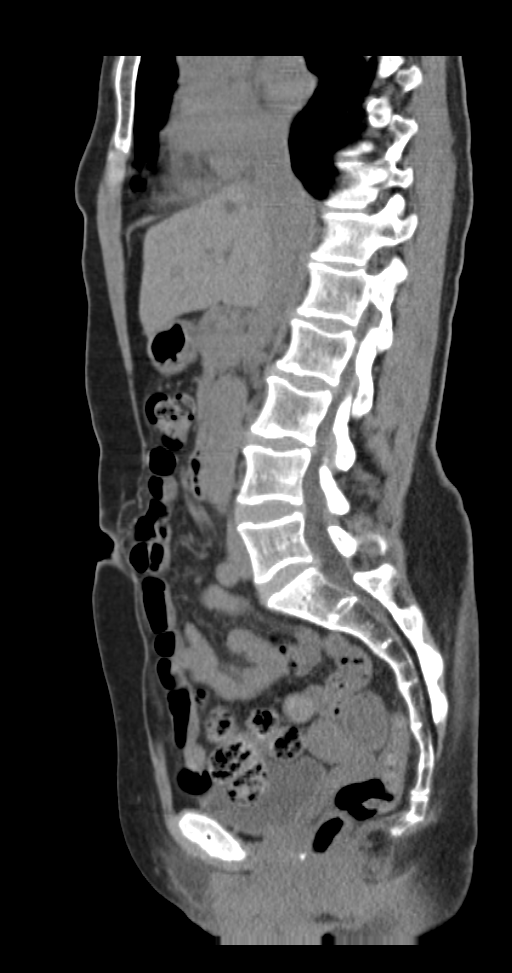

[12 of 40 positions shown; findings below may reference images not displayed]

FINDINGS: Stable lung bases with mild dependent atelectasis or scarring. Mild
cardiomegaly. No pericardial or pleural effusion.

No acute osseous abnormality identified.

No pelvic free fluid. Negative non contrast uterus and adnexa.
Decompressed distal colon. Chronic mid sigmoid anastomosis appears
stable.

Stable left colon, located medial to normal appearing small bowel
loops. Occasional colonic diverticula. The cecum is located in the
right hemipelvis. Negative appendix. No dilated small bowel.
Decompressed stomach and duodenum.

Noncontrast liver, gallbladder, spleen, pancreas and adrenal glands
are stable. No abdominal free fluid. No lymphadenopathy.

Chronic right nephrolithiasis. No right hydronephrosis. Decompressed
right ureter. Diminutive, unremarkable bladder. Chronic pelvic
phleboliths appear stable.

Chronic left nephrolithiasis. Left hydronephrosis similar to the
9666 exam. Two small proximal calculi at the left ureteropelvic
junction. The larger is 3-4 mm (coronal image 45). See also series
2, image 36. Distal to these calculi the left ureter is decompressed
to the bladder.
IMPRESSION: 1. Acute obstructive uropathy on the left with 2 small calculi at
the left UPJ. The larger is 3-4 mm.
2. Chronic nephrolithiasis.
3. No other acute process in the abdomen or pelvis.

## 2017-01-27 IMAGING — CT CT HEAD W/O CM
2 series · 16 of 30 positions shown, 20 images · non-contrast
Comparison: MRI brain 03/31/2013.  CT maxillofacial 10/25/2011.

CLINICAL DATA: Migraine headaches. History of bladder cancer.
Surgery today for kidney stones.

EXAM:
CT HEAD WITHOUT CONTRAST
TECHNIQUE: Contiguous axial images were obtained from the base of the skull
through the vertex without intravenous contrast.

[Series 2: head w/o · axial · non-contrast · 0.43mm/px · z∈[-139,-19]mm · 13 of 30 slices shown, 17 images]
[im 3/30  brain]
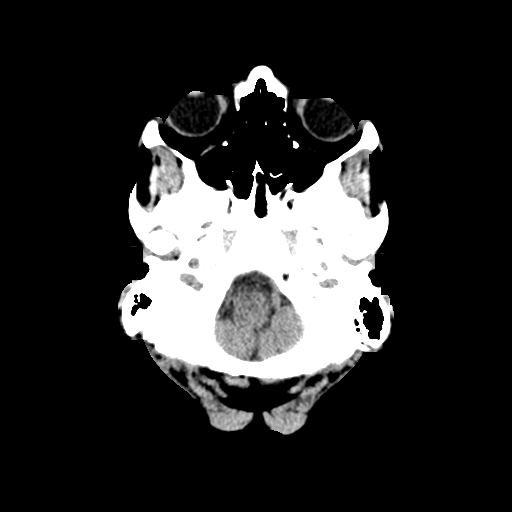
[im 3/30  bone]
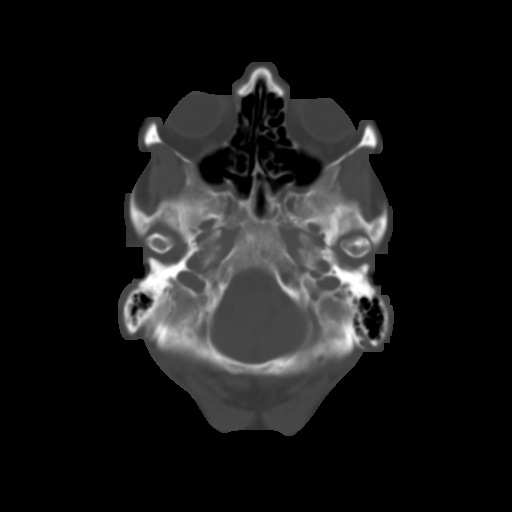
[im 5/30  brain]
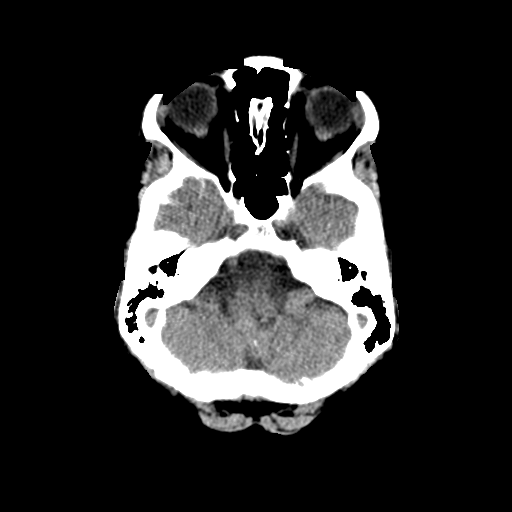
[im 7/30  brain]
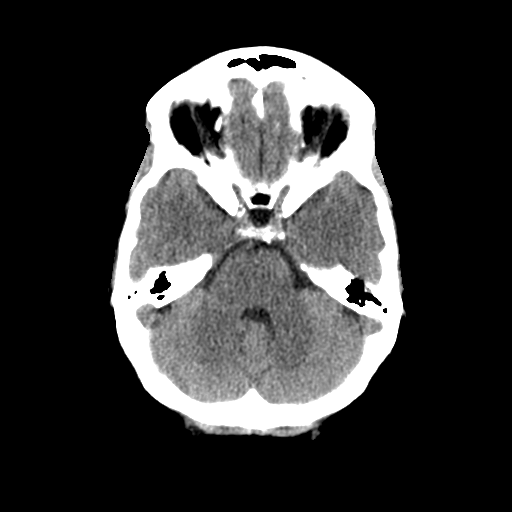
[im 9/30  brain]
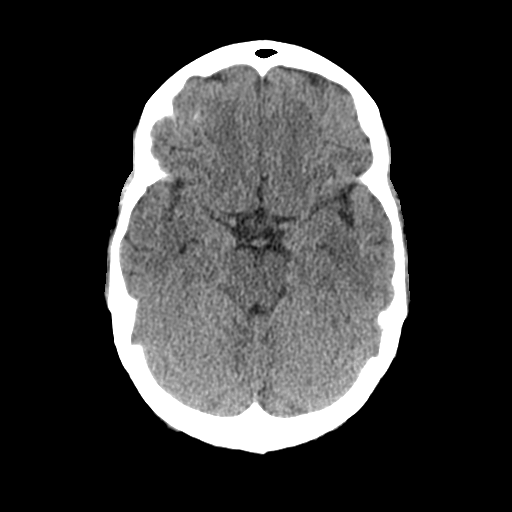
[im 11/30  brain]
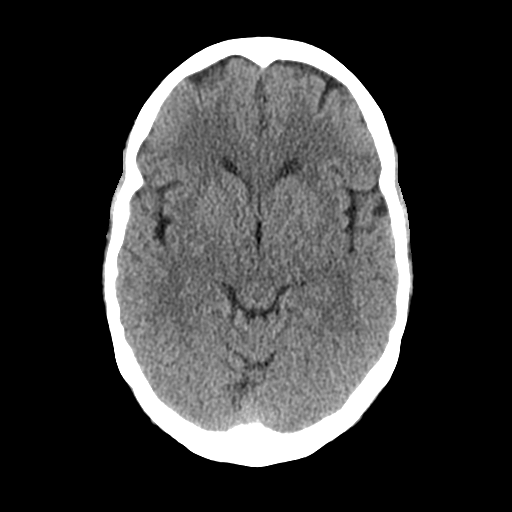
[im 11/30  bone]
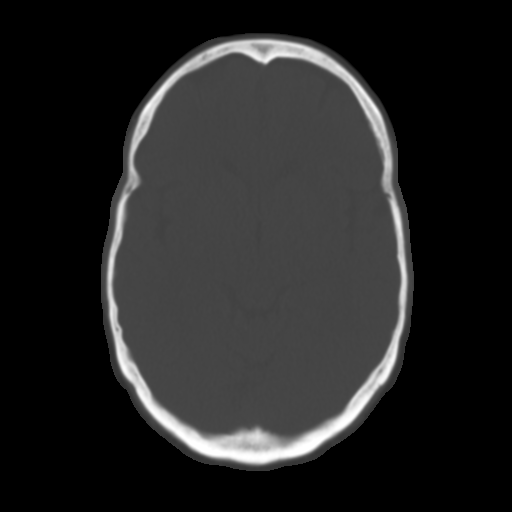
[im 13/30  brain]
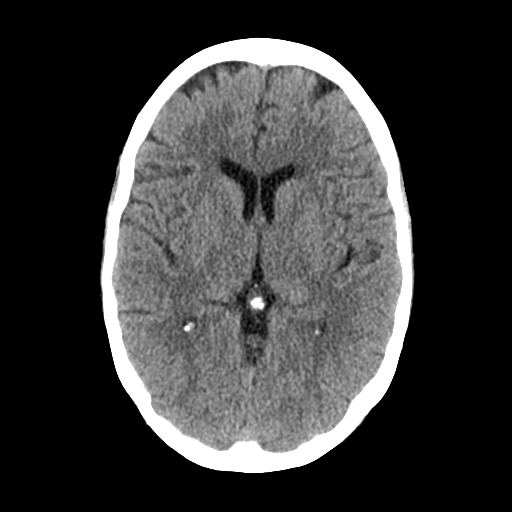
[im 15/30  brain]
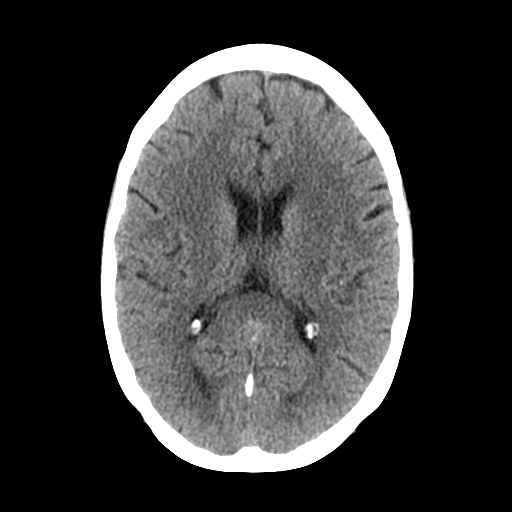
[im 17/30  brain]
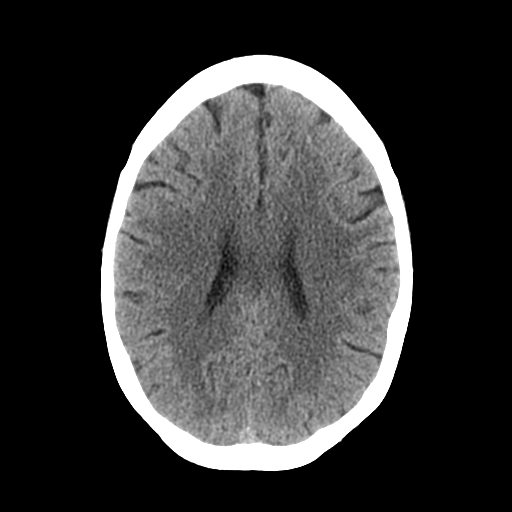
[im 19/30  brain]
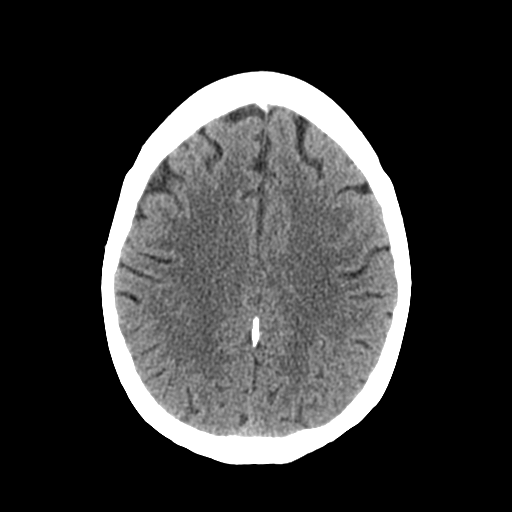
[im 19/30  bone]
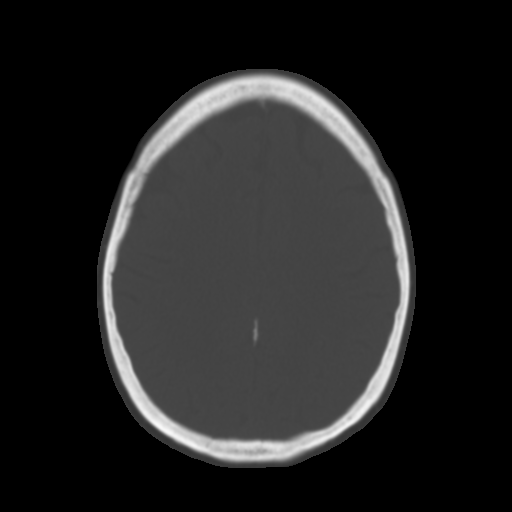
[im 21/30  brain]
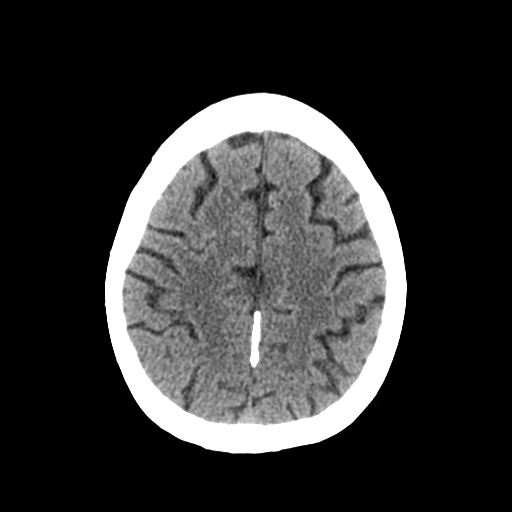
[im 23/30  brain]
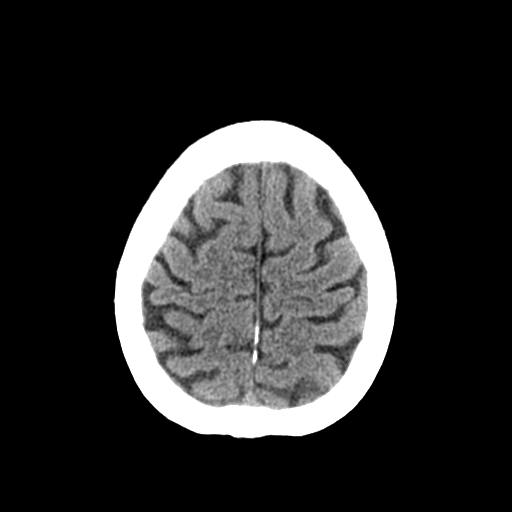
[im 25/30  brain]
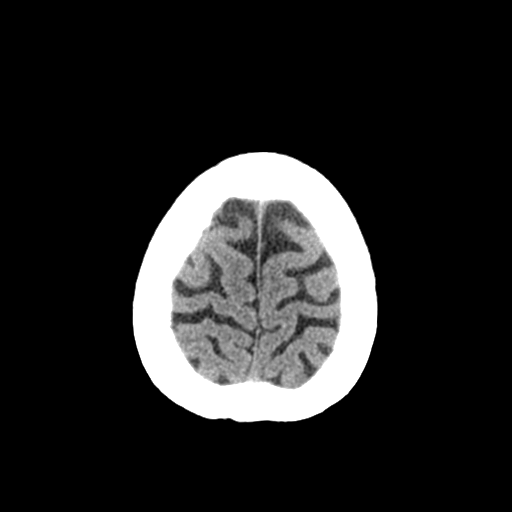
[im 27/30  brain]
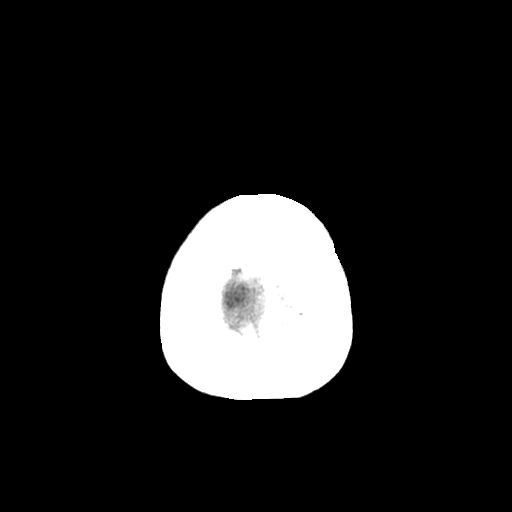
[im 27/30  bone]
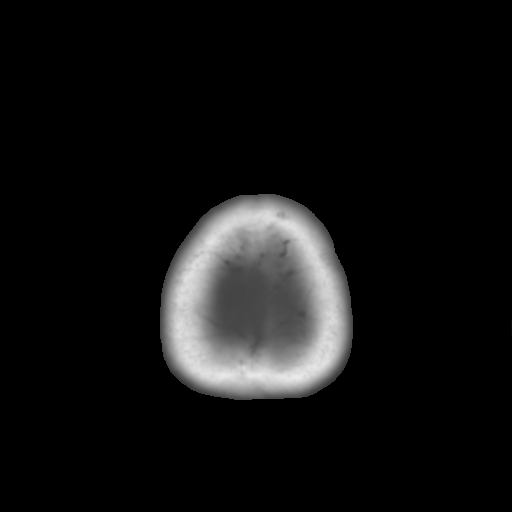

[Series 3: bone windows · axial · 0.43mm/px · z∈[-139,-99]mm · 3 of 30 slices shown]
[im 3/30  bone]
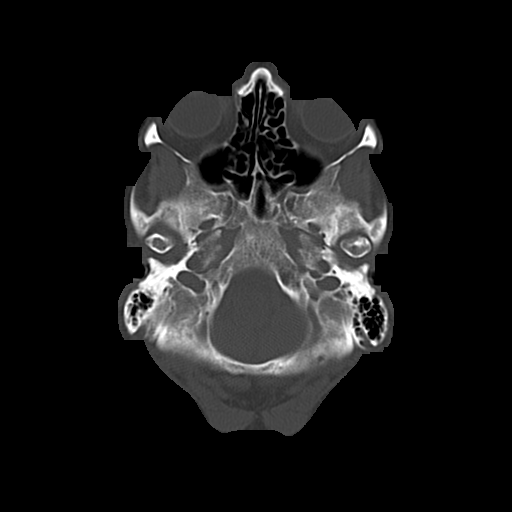
[im 7/30  bone]
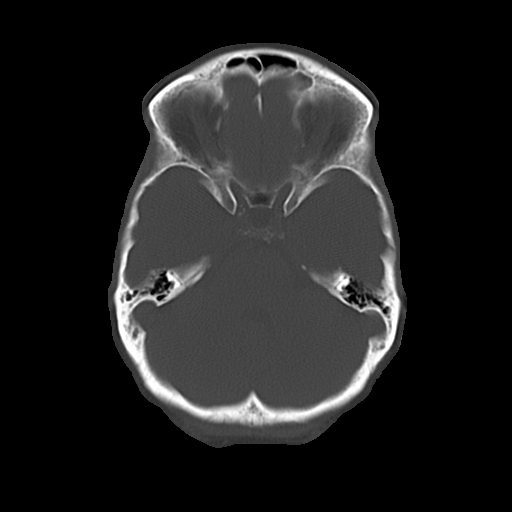
[im 11/30  bone]
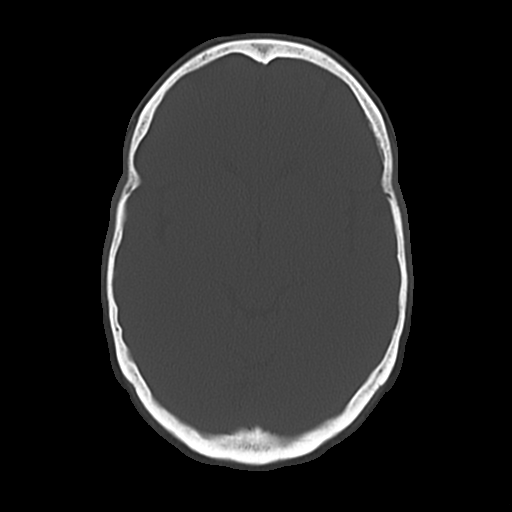

[16 of 30 positions shown; findings below may reference images not displayed]

FINDINGS: Ventricles and sulci appear symmetrical. No mass effect or midline
shift. No abnormal extra-axial fluid collections. Gray-white matter
junctions are distinct. Basal cisterns are not effaced. No evidence
of acute intracranial hemorrhage. No depressed skull fractures.
Visualized paranasal sinuses and mastoid air cells are not
opacified.
IMPRESSION: No acute intracranial abnormalities.

## 2017-03-02 ENCOUNTER — Telehealth: Payer: Self-pay | Admitting: *Deleted

## 2017-03-02 NOTE — Telephone Encounter (Signed)
On Friday, Nov 24, 2016, we sent notice to you, requesting verification of continued prolia injections for this pt. As of today, we have not received a response and want to verify that prolia continues to be a recommended treatment for this pt. At this time, the patient is six (6) months to one (1) year behind in receiving the prolia injection. Please reply to this message by Wednesday, Mar 07, 2017 at 5pm so that we may take the necessary steps to ensure continued care for your patient. Thank you  

## 2017-03-02 NOTE — Telephone Encounter (Signed)
Pt lost for f/u.

## 2017-03-05 ENCOUNTER — Telehealth: Payer: Self-pay

## 2017-03-05 NOTE — Telephone Encounter (Signed)
Called and LVM advising patient to call the office back to make appointment with Dr.Gherghe to discuss if Prolia is still an option. Left call back number for patient to call back.

## 2017-12-17 ENCOUNTER — Encounter (HOSPITAL_BASED_OUTPATIENT_CLINIC_OR_DEPARTMENT_OTHER): Payer: Self-pay | Admitting: *Deleted

## 2017-12-17 ENCOUNTER — Other Ambulatory Visit: Payer: Self-pay

## 2017-12-17 ENCOUNTER — Other Ambulatory Visit: Payer: Self-pay | Admitting: Orthopedic Surgery

## 2017-12-17 DIAGNOSIS — S52501A Unspecified fracture of the lower end of right radius, initial encounter for closed fracture: Secondary | ICD-10-CM

## 2017-12-17 DIAGNOSIS — S62309A Unspecified fracture of unspecified metacarpal bone, initial encounter for closed fracture: Secondary | ICD-10-CM

## 2017-12-17 DIAGNOSIS — T07XXXA Unspecified multiple injuries, initial encounter: Secondary | ICD-10-CM

## 2017-12-17 HISTORY — DX: Unspecified fracture of unspecified metacarpal bone, initial encounter for closed fracture: S62.309A

## 2017-12-17 HISTORY — DX: Unspecified fracture of the lower end of right radius, initial encounter for closed fracture: S52.501A

## 2017-12-17 HISTORY — DX: Unspecified multiple injuries, initial encounter: T07.XXXA

## 2017-12-17 NOTE — H&P (Addendum)
Rebekah Herandez is an 67 y.o. female.   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  She fell playing pickleball at the [facility] roughly a week ago, sustaining what is likely a nondisplaced fracture of the distal radius.  This was evaluated on 12-10-17 by Dr. Berenice Primas, she was placed into a splint.  She reports that she tripped on the sidewalk today while wearing the brace, about 11 AM, and sustained a new and different injury to the ulnar side of the hand.  It is painful and swollen.  In addition, she has had some ongoing anterior knee pain that has worsened.  She reports falling onto the knee as well.  Past Medical History:  Diagnosis Date  . Abrasions of multiple sites 12/17/2017   right knee, bilateral hand  . Dental crowns present   . Distal radius fracture, right 12/17/2017  . History of bladder cancer 2014  . History of diverticulitis of colon   . History of kidney stones   . Hyperlipidemia    no current med.  . Immature cataract of both eyes   . Metacarpal bone fracture 12/17/2017   right  . Migraines   . Osteoporosis   . PONV (postoperative nausea and vomiting)   . Seasonal allergies     Past Surgical History:  Procedure Laterality Date  . BLEPHAROPLASTY - BILATERAL    . BRAVO [place] STUDY N/A 08/24/2014   Procedure: BRAVO [place];  Surgeon: Lear Ng, MD;  Location: [facility];  Service: Endoscopy;  Laterality: N/A;  . CESAREAN SECTION    . CYSTOSCOPY W/ RETROGRADES Bilateral 06/19/2013   Procedure: CYSTOSCOPY WITH RETROGRADE PYELOGRAM;  Surgeon: Molli Hazard, MD;  Location: [facility];  Service: Urology;  Laterality: Bilateral;  . CYSTOSCOPY WITH RETROGRADE PYELOGRAM, URETEROSCOPY AND STENT PLACEMENT Right 04/21/2015   Procedure: CYSTOSCOPY WITH RIGHT  RETROGRADE PYELOGRAM, RIGHT DIAGNOSTIC  URETEROSCOPY AND STENT PLACEMENT;  Surgeon: Alexis Frock,  MD;  Location: [facility];  Service: Urology;  Laterality: Right;  . CYSTOSCOPY WITH RETROGRADE PYELOGRAM, URETEROSCOPY AND STENT PLACEMENT Right 05/13/2015   Procedure: CYSTOSCOPY WITH RETROGRADE PYELOGRAM, URETEROSCOPY AND STENT EXCHANGE;  Surgeon: Alexis Frock, MD;  Location: [facility];  Service: Urology;  Laterality: Right;  . DILATION AND CURETTAGE OF UTERUS    . ELBOW ARTHROSCOPY Left 09/28/2006  . ESOPHAGOGASTRODUODENOSCOPY N/A 08/24/2014   Procedure: ESOPHAGOGASTRODUODENOSCOPY (EGD);  Surgeon: Lear Ng, MD;  Location: [facility];  Service: Endoscopy;  Laterality: N/A;  . LAPAROSCOPIC SIGMOID COLECTOMY  2004  . ORIF ELBOW FRACTURE Left   . ORIF FOOT FRACTURE Left   . ORIF LEFT ELBOW    . RESECTION BONE TUMOR FOOT Right 02/2016  . STONE EXTRACTION WITH BASKET Right 05/13/2015   Procedure: STONE EXTRACTION WITH BASKET;  Surgeon: Alexis Frock, MD;  Location: [facility];  Service: Urology;  Laterality: Right;  . TRANSURETHRAL RESECTION OF BLADDER TUMOR WITH GYRUS (TURBT-GYRUS) N/A 06/19/2013   Procedure: TRANSURETHRAL RESECTION OF BLADDER TUMOR WITH GYRUS (TURBT-GYRUS);  Surgeon: Molli Hazard, MD;  Location: [facility];  Service: Urology;  Laterality: N/A;  . TRANSURETHRAL RESECTION OF BLADDER TUMOR WITH GYRUS (TURBT-GYRUS) N/A 04/21/2015   Procedure: TRANSURETHRAL RESECTION OF BLADDER TUMOR WITH GYRUS (TURBT-GYRUS);  Surgeon: Alexis Frock, MD;  Location: [facility];  Service: Urology;  Laterality: N/A;    Family History  Problem  Relation Age of Onset  . Hypertension Brother   . Diabetes Brother   . Hyperlipidemia Brother   . Heart disease Brother   . Cancer Mother   . Heart disease Father   . Cancer Sister    Social History:  reports that  has never smoked. she has never used smokeless tobacco. She reports that she does not drink alcohol or use drugs.  Allergies:  Allergies  Allergen Reactions   . Avelox [Moxifloxacin] Hives and Shortness Of Breath  . Hydrocodone-Acetaminophen Other (See Comments)    HALLUCINATIONS  . Pseudoephedrine Other (See Comments)    FEELS JITTERY  . Sulfa Antibiotics Hives and Itching    No medications prior to admission.    No results found for this or any previous visit (from the past 48 hour(s)). No results found.  Review of Systems  All other systems reviewed and are negative.   Height 5\' 3"  (1.6 m), weight 56.7 kg (125 lb). Physical Exam  Constitutional:  WD, WN, NAD HEENT:  NCAT, EOMI Neuro/Psych:  Alert & oriented to person, place, and time; appropriate mood & affect Lymphatic: No generalized UE edema or lymphadenopathy Extremities / MSK:  Both UE are normal with respect to appearance, ranges of motion, joint stability, muscle strength/tone, sensation, & perfusion except as otherwise noted:  The right hand is swollen and tender at the base of the fifth metacarpal.  There appears to be a fracture subluxation at the base of the fifth metacarpal, without significant digital malrotation.  There is also tender at the radial aspect of the distal radius, mostly dorsal-radial and radial  The right knee is not swollen.  There is tenderness at the margins of the patella both medially and laterally, not so much along the joint line of the proximal tibia.  Ligamentously stable.  She can maintain a straight leg raise.  Labs / Xrays:  Multiple views of the right knee ordered and obtained today reveals no clear fractures, dislocations, or bony destructive lesions.  There are some mild to moderate degenerative changes, and on the sunrise view noted patellofemoral arthritis.  3 views of the right hand supplemented with fluoroscopic evaluation reveals an intra-articular fracture to the base of the fifth metacarpal with fracture subluxation.  There is a typical retained fragment adjacent to the fourth metacarpal and the remainder appears displaced proximally  dorsally and ulnarly.  In addition, linear lucency through the distal radius that is intra-articular but nondisplaced is also noted, oblique in orientation  Assessment: 1.  Nondisplaced right distal radius fracture, intra-articular, 38 week old 2.  Acute right fifth metacarpal base fracture subluxation 3.  Right knee pain with patellofemoral arthritis, no clear fracture evident.  Plan:  I discussed these findings with her.  She is placed onto the surgery schedule for tomorrow for likely close reduction percutaneous pins stabilization of the right fifth metacarpal fracture subluxation.  I will look at the distal radius fluoroscopically at that time and decide whether or not to pin it in the same setting.  In addition, we spoke about possibly a steroid injection for her knee, and she would like to do that while she is under general anesthesia, which I think is reasonable.  She was placed into an ulnar gutter splint for her hand today as a temporizing measure.  We discussed a plan that included an NSAID, Tylenol, and her baseline Fiorinal that she reportedly does not do well with other narcotics.  The details of the operative procedure were discussed with  the patient.  Questions were invited and answered.  In addition to the goal of the procedure, the risks of the procedure to include but not limited to bleeding; infection; damage to the nerves or blood vessels that could result in bleeding, numbness, weakness, chronic pain, and the need for additional procedures; stiffness; the need for revision surgery; and anesthetic risks were reviewed.  No specific outcome was guaranteed or implied.  Informed consent was obtained.  Jolyn Nap, MD 12/17/2017, 6:54 PM

## 2017-12-17 NOTE — Anesthesia Preprocedure Evaluation (Signed)
Anesthesia Evaluation  Patient identified by MRN, date of birth, ID band Patient awake    Reviewed: Allergy & Precautions, NPO status , Patient's Chart, lab work & pertinent test results  History of Anesthesia Complications (+) PONV and history of anesthetic complications  Airway Mallampati: II  TM Distance: >3 FB Neck ROM: Full    Dental no notable dental hx. (+) Dental Advisory Given, Teeth Intact   Pulmonary shortness of breath and with exertion,    Pulmonary exam normal breath sounds clear to auscultation       Cardiovascular Exercise Tolerance: Good negative cardio ROS Normal cardiovascular exam Rhythm:Regular Rate:Normal  ECG : SB, LAFB,  Q waves V1 and V2   Neuro/Psych  Headaches,  Neuromuscular disease negative psych ROS   GI/Hepatic Neg liver ROS, hiatal hernia, GERD  Medicated,  Endo/Other  negative endocrine ROS  Renal/GU Renal diseaseBladder cancer  negative genitourinary   Musculoskeletal  (+) Arthritis ,   Abdominal (+)  Abdomen: tender.    Peds negative pediatric ROS (+)  Hematology negative hematology ROS (+)   Anesthesia Other Findings   Reproductive/Obstetrics negative OB ROS                             Anesthesia Physical  Anesthesia Plan  ASA: III  Anesthesia Plan: General   Post-op Pain Management: GA combined w/ Regional for post-op pain   Induction: Intravenous  PONV Risk Score and Plan:   Airway Management Planned: LMA  Additional Equipment:   Intra-op Plan:   Post-operative Plan:   Informed Consent: I have reviewed the patients History and Physical, chart, labs and discussed the procedure including the risks, benefits and alternatives for the proposed anesthesia with the patient or authorized representative who has indicated his/her understanding and acceptance.   Dental advisory given  Plan Discussed with: Surgeon, CRNA and  Anesthesiologist  Anesthesia Plan Comments:         Anesthesia Quick Evaluation

## 2017-12-18 ENCOUNTER — Encounter (HOSPITAL_BASED_OUTPATIENT_CLINIC_OR_DEPARTMENT_OTHER)
Admission: RE | Disposition: A | Discharge status: Home / Self Care | Payer: Self-pay | Source: Ambulatory Visit | Attending: Orthopedic Surgery

## 2017-12-18 ENCOUNTER — Ambulatory Visit (HOSPITAL_COMMUNITY): Payer: Medicare Other

## 2017-12-18 ENCOUNTER — Other Ambulatory Visit: Payer: Self-pay

## 2017-12-18 ENCOUNTER — Ambulatory Visit (HOSPITAL_BASED_OUTPATIENT_CLINIC_OR_DEPARTMENT_OTHER)
Admission: RE | Admit: 2017-12-18 | Discharge: 2017-12-18 | Disposition: A | Discharge status: Home / Self Care | Payer: Medicare Other | Source: Ambulatory Visit | Attending: Orthopedic Surgery | Admitting: Orthopedic Surgery

## 2017-12-18 ENCOUNTER — Ambulatory Visit (HOSPITAL_BASED_OUTPATIENT_CLINIC_OR_DEPARTMENT_OTHER): Payer: Medicare Other | Admitting: Anesthesiology

## 2017-12-18 ENCOUNTER — Encounter (HOSPITAL_BASED_OUTPATIENT_CLINIC_OR_DEPARTMENT_OTHER): Payer: Self-pay

## 2017-12-18 DIAGNOSIS — Z419 Encounter for procedure for purposes other than remedying health state, unspecified: Secondary | ICD-10-CM

## 2017-12-18 DIAGNOSIS — Z885 Allergy status to narcotic agent status: Secondary | ICD-10-CM | POA: Diagnosis not present

## 2017-12-18 DIAGNOSIS — Z882 Allergy status to sulfonamides status: Secondary | ICD-10-CM | POA: Insufficient documentation

## 2017-12-18 DIAGNOSIS — Z79899 Other long term (current) drug therapy: Secondary | ICD-10-CM | POA: Diagnosis not present

## 2017-12-18 DIAGNOSIS — W010XXA Fall on same level from slipping, tripping and stumbling without subsequent striking against object, initial encounter: Secondary | ICD-10-CM | POA: Diagnosis not present

## 2017-12-18 DIAGNOSIS — E785 Hyperlipidemia, unspecified: Secondary | ICD-10-CM | POA: Diagnosis not present

## 2017-12-18 DIAGNOSIS — S52501A Unspecified fracture of the lower end of right radius, initial encounter for closed fracture: Secondary | ICD-10-CM | POA: Diagnosis present

## 2017-12-18 DIAGNOSIS — S52571A Other intraarticular fracture of lower end of right radius, initial encounter for closed fracture: Secondary | ICD-10-CM | POA: Diagnosis not present

## 2017-12-18 DIAGNOSIS — S62316A Displaced fracture of base of fifth metacarpal bone, right hand, initial encounter for closed fracture: Secondary | ICD-10-CM | POA: Diagnosis not present

## 2017-12-18 DIAGNOSIS — Z87442 Personal history of urinary calculi: Secondary | ICD-10-CM | POA: Insufficient documentation

## 2017-12-18 DIAGNOSIS — Y9373 Activity, racquet and hand sports: Secondary | ICD-10-CM | POA: Insufficient documentation

## 2017-12-18 DIAGNOSIS — M1711 Unilateral primary osteoarthritis, right knee: Secondary | ICD-10-CM | POA: Diagnosis not present

## 2017-12-18 DIAGNOSIS — Y92480 Sidewalk as the place of occurrence of the external cause: Secondary | ICD-10-CM | POA: Insufficient documentation

## 2017-12-18 DIAGNOSIS — Z8551 Personal history of malignant neoplasm of bladder: Secondary | ICD-10-CM | POA: Diagnosis not present

## 2017-12-18 HISTORY — PX: INJECTION KNEE: SHX2446

## 2017-12-18 HISTORY — DX: Migraine, unspecified, not intractable, without status migrainosus: G43.909

## 2017-12-18 HISTORY — DX: Unspecified fracture of the lower end of right radius, initial encounter for closed fracture: S52.501A

## 2017-12-18 HISTORY — DX: Dental restoration status: Z98.811

## 2017-12-18 HISTORY — DX: Personal history of urinary calculi: Z87.442

## 2017-12-18 HISTORY — DX: Unspecified multiple injuries, initial encounter: T07.XXXA

## 2017-12-18 HISTORY — DX: Age-related osteoporosis without current pathological fracture: M81.0

## 2017-12-18 HISTORY — DX: Personal history of malignant neoplasm of bladder: Z85.51

## 2017-12-18 HISTORY — DX: Unspecified fracture of unspecified metacarpal bone, initial encounter for closed fracture: S62.309A

## 2017-12-18 HISTORY — PX: CLOSED REDUCTION METACARPAL WITH PERCUTANEOUS PINNING: SHX5613

## 2017-12-18 HISTORY — DX: Unspecified cataract: H26.9

## 2017-12-18 SURGERY — CLOSED REDUCTION, FRACTURE, METACARPAL BONE, WITH PERCUTANEOUS PINNING
Anesthesia: General | Site: Knee | Laterality: Right

## 2017-12-18 MED ORDER — FENTANYL CITRATE (PF) 100 MCG/2ML IJ SOLN
INTRAMUSCULAR | Status: AC
  Filled 2017-12-18: qty 2

## 2017-12-18 MED ORDER — LIDOCAINE HCL (CARDIAC) 20 MG/ML IV SOLN
INTRAVENOUS | Status: DC | PRN
  Administered 2017-12-18: 10 mg via INTRAVENOUS

## 2017-12-18 MED ORDER — ACETAMINOPHEN 160 MG/5ML PO SOLN: 325.0000 mg | ORAL | Status: DC | PRN

## 2017-12-18 MED ORDER — DEXAMETHASONE SODIUM PHOSPHATE 4 MG/ML IJ SOLN
INTRAMUSCULAR | Status: DC | PRN
  Administered 2017-12-18: 10 mg via INTRAVENOUS

## 2017-12-18 MED ORDER — METHYLPREDNISOLONE ACETATE 40 MG/ML IJ SUSP
INTRAMUSCULAR | Status: DC | PRN
  Administered 2017-12-18: 40 mg

## 2017-12-18 MED ORDER — KETOROLAC TROMETHAMINE 30 MG/ML IJ SOLN: 30.0000 mg | Freq: Once | INTRAMUSCULAR | Status: DC | PRN

## 2017-12-18 MED ORDER — ONDANSETRON HCL 4 MG/2ML IJ SOLN: 4.0000 mg | Freq: Once | INTRAMUSCULAR | Status: DC | PRN

## 2017-12-18 MED ORDER — MEPERIDINE HCL 25 MG/ML IJ SOLN: 6.2500 mg | INTRAMUSCULAR | Status: DC | PRN

## 2017-12-18 MED ORDER — ARTIFICIAL TEARS OPHTHALMIC OINT
TOPICAL_OINTMENT | OPHTHALMIC | Status: AC
  Filled 2017-12-18: qty 3.5

## 2017-12-18 MED ORDER — OXYCODONE HCL 5 MG/5ML PO SOLN: 5.0000 mg | Freq: Once | ORAL | Status: DC | PRN

## 2017-12-18 MED ORDER — BUPIVACAINE HCL (PF) 0.5 % IJ SOLN
INTRAMUSCULAR | Status: AC
  Filled 2017-12-18: qty 60

## 2017-12-18 MED ORDER — METHYLPREDNISOLONE ACETATE 40 MG/ML IJ SUSP
INTRAMUSCULAR | Status: AC
Start: 2017-12-18 — End: 2017-12-18
  Filled 2017-12-18: qty 1

## 2017-12-18 MED ORDER — OXYCODONE HCL 5 MG PO TABS: 5.0000 mg | ORAL_TABLET | Freq: Once | ORAL | Status: DC | PRN

## 2017-12-18 MED ORDER — LACTATED RINGERS IV SOLN
INTRAVENOUS | Status: DC
  Administered 2017-12-18: 08:00:00 via INTRAVENOUS

## 2017-12-18 MED ORDER — TRIAMCINOLONE ACETONIDE 40 MG/ML IJ SUSP
INTRAMUSCULAR | Status: AC
Start: 2017-12-18 — End: 2017-12-18
  Filled 2017-12-18: qty 5

## 2017-12-18 MED ORDER — SCOPOLAMINE 1 MG/3DAYS TD PT72: 1.0000 | MEDICATED_PATCH | Freq: Once | TRANSDERMAL | Status: DC | PRN

## 2017-12-18 MED ORDER — FENTANYL CITRATE (PF) 100 MCG/2ML IJ SOLN: 50.0000 ug | INTRAMUSCULAR | Status: DC | PRN

## 2017-12-18 MED ORDER — ONDANSETRON HCL 4 MG/2ML IJ SOLN
INTRAMUSCULAR | Status: AC
  Filled 2017-12-18: qty 2

## 2017-12-18 MED ORDER — BUPIVACAINE-EPINEPHRINE (PF) 0.5% -1:200000 IJ SOLN
INTRAMUSCULAR | Status: AC
  Filled 2017-12-18: qty 30

## 2017-12-18 MED ORDER — FENTANYL CITRATE (PF) 100 MCG/2ML IJ SOLN
INTRAMUSCULAR | Status: DC | PRN
  Administered 2017-12-18: 100 ug via INTRAVENOUS

## 2017-12-18 MED ORDER — BUPIVACAINE-EPINEPHRINE 0.25% -1:200000 IJ SOLN
INTRAMUSCULAR | Status: AC
Start: 2017-12-18 — End: 2017-12-18
  Filled 2017-12-18: qty 1

## 2017-12-18 MED ORDER — LIDOCAINE HCL (PF) 1 % IJ SOLN
INTRAMUSCULAR | Status: DC | PRN
  Administered 2017-12-18: 6 mL

## 2017-12-18 MED ORDER — ACETAMINOPHEN 325 MG PO TABS: 325.0000 mg | ORAL_TABLET | ORAL | Status: DC | PRN

## 2017-12-18 MED ORDER — ONDANSETRON HCL 4 MG/2ML IJ SOLN
INTRAMUSCULAR | Status: DC | PRN
  Administered 2017-12-18: 4 mg via INTRAVENOUS

## 2017-12-18 MED ORDER — KETOROLAC TROMETHAMINE 30 MG/ML IJ SOLN
INTRAMUSCULAR | Status: AC
  Filled 2017-12-18: qty 1

## 2017-12-18 MED ORDER — MIDAZOLAM HCL 2 MG/2ML IJ SOLN
INTRAMUSCULAR | Status: AC
  Filled 2017-12-18: qty 2

## 2017-12-18 MED ORDER — KETOROLAC TROMETHAMINE 30 MG/ML IJ SOLN
INTRAMUSCULAR | Status: DC | PRN
  Administered 2017-12-18: 30 mg via INTRAVENOUS

## 2017-12-18 MED ORDER — PROPOFOL 500 MG/50ML IV EMUL
INTRAVENOUS | Status: AC
  Filled 2017-12-18: qty 100

## 2017-12-18 MED ORDER — LACTATED RINGERS IV SOLN
INTRAVENOUS | Status: DC
  Administered 2017-12-18: 07:00:00 via INTRAVENOUS

## 2017-12-18 MED ORDER — EPHEDRINE 5 MG/ML INJ
INTRAVENOUS | Status: AC
  Filled 2017-12-18: qty 10

## 2017-12-18 MED ORDER — MIDAZOLAM HCL 2 MG/2ML IJ SOLN: 1.0000 mg | INTRAMUSCULAR | Status: DC | PRN

## 2017-12-18 MED ORDER — DEXAMETHASONE SODIUM PHOSPHATE 10 MG/ML IJ SOLN
INTRAMUSCULAR | Status: AC
Start: 2017-12-18 — End: 2017-12-18
  Filled 2017-12-18: qty 1

## 2017-12-18 MED ORDER — LIDOCAINE 2% (20 MG/ML) 5 ML SYRINGE
INTRAMUSCULAR | Status: AC
  Filled 2017-12-18: qty 5

## 2017-12-18 MED ORDER — SCOPOLAMINE 1 MG/3DAYS TD PT72
MEDICATED_PATCH | TRANSDERMAL | Status: AC
  Filled 2017-12-18: qty 1

## 2017-12-18 MED ORDER — FENTANYL CITRATE (PF) 100 MCG/2ML IJ SOLN: 25.0000 ug | INTRAMUSCULAR | Status: DC | PRN

## 2017-12-18 MED ORDER — ONDANSETRON HCL 4 MG PO TABS: 4.0000 mg | ORAL_TABLET | Freq: Three times a day (TID) | ORAL | 0 refills | Status: DC | PRN

## 2017-12-18 MED ORDER — LIDOCAINE HCL (PF) 1 % IJ SOLN
INTRAMUSCULAR | Status: AC
Start: 2017-12-18 — End: 2017-12-18
  Filled 2017-12-18: qty 30

## 2017-12-18 MED ORDER — CEFAZOLIN SODIUM-DEXTROSE 2-4 GM/100ML-% IV SOLN
2.0000 g | INTRAVENOUS | Status: AC
  Administered 2017-12-18: 2 g via INTRAVENOUS

## 2017-12-18 MED ORDER — PHENYLEPHRINE 40 MCG/ML (10ML) SYRINGE FOR IV PUSH (FOR BLOOD PRESSURE SUPPORT)
PREFILLED_SYRINGE | INTRAVENOUS | Status: AC
  Filled 2017-12-18: qty 10

## 2017-12-18 MED ORDER — BUPIVACAINE-EPINEPHRINE 0.5% -1:200000 IJ SOLN
INTRAMUSCULAR | Status: DC | PRN
  Administered 2017-12-18: 10 mL

## 2017-12-18 MED ORDER — CEFAZOLIN SODIUM-DEXTROSE 2-4 GM/100ML-% IV SOLN
INTRAVENOUS | Status: AC
  Filled 2017-12-18: qty 100

## 2017-12-18 MED ORDER — MIDAZOLAM HCL 5 MG/5ML IJ SOLN
INTRAMUSCULAR | Status: DC | PRN
  Administered 2017-12-18: 2 mg via INTRAVENOUS

## 2017-12-18 MED ORDER — PROPOFOL 10 MG/ML IV BOLUS
INTRAVENOUS | Status: DC | PRN
  Administered 2017-12-18: 150 mg via INTRAVENOUS

## 2017-12-18 MED ORDER — TRAMADOL HCL 50 MG PO TABS: 50.0000 mg | ORAL_TABLET | Freq: Four times a day (QID) | ORAL | 0 refills | Status: DC | PRN

## 2017-12-18 SURGICAL SUPPLY — 45 items
BANDAGE COBAN STERILE 2 (GAUZE/BANDAGES/DRESSINGS) IMPLANT
BLADE MINI RND TIP GREEN BEAV (BLADE) IMPLANT
BLADE SURG 15 STRL LF DISP TIS (BLADE) ×2 IMPLANT
BLADE SURG 15 STRL SS (BLADE) ×1
BNDG COHESIVE 4X5 TAN STRL (GAUZE/BANDAGES/DRESSINGS) ×3 IMPLANT
BNDG ESMARK 4X9 LF (GAUZE/BANDAGES/DRESSINGS) IMPLANT
BNDG GAUZE ELAST 4 BULKY (GAUZE/BANDAGES/DRESSINGS) ×3 IMPLANT
CHLORAPREP W/TINT 26ML (MISCELLANEOUS) ×3 IMPLANT
CORD BIPOLAR FORCEPS 12FT (ELECTRODE) IMPLANT
COVER BACK TABLE 60X90IN (DRAPES) ×3 IMPLANT
COVER MAYO STAND STRL (DRAPES) ×3 IMPLANT
CUFF TOURNIQUET SINGLE 18IN (TOURNIQUET CUFF) ×3 IMPLANT
DRAPE C-ARM 42X72 X-RAY (DRAPES) ×3 IMPLANT
DRAPE EXTREMITY T 121X128X90 (DRAPE) ×3 IMPLANT
DRAPE SURG 17X23 STRL (DRAPES) ×3 IMPLANT
DRSG EMULSION OIL 3X3 NADH (GAUZE/BANDAGES/DRESSINGS) ×3 IMPLANT
GAUZE SPONGE 4X4 12PLY STRL LF (GAUZE/BANDAGES/DRESSINGS) ×3 IMPLANT
GLOVE BIO SURGEON STRL SZ7.5 (GLOVE) ×6 IMPLANT
GLOVE BIOGEL PI IND STRL 7.0 (GLOVE) ×2 IMPLANT
GLOVE BIOGEL PI IND STRL 8 (GLOVE) ×4 IMPLANT
GLOVE BIOGEL PI INDICATOR 7.0 (GLOVE) ×1
GLOVE BIOGEL PI INDICATOR 8 (GLOVE) ×2
GLOVE ECLIPSE 6.5 STRL STRAW (GLOVE) ×3 IMPLANT
GOWN STRL REUS W/TWL XL LVL3 (GOWN DISPOSABLE) ×12 IMPLANT
K-WIRE .045X4 (WIRE) ×6 IMPLANT
NEEDLE HYPO 22GX1.5 SAFETY (NEEDLE) ×6 IMPLANT
NS IRRIG 1000ML POUR BTL (IV SOLUTION) ×3 IMPLANT
PACK BASIN DAY SURGERY FS (CUSTOM PROCEDURE TRAY) ×3 IMPLANT
PAD CAST 3X4 CTTN HI CHSV (CAST SUPPLIES) ×2 IMPLANT
PADDING CAST ABS 4INX4YD NS (CAST SUPPLIES)
PADDING CAST ABS COTTON 4X4 ST (CAST SUPPLIES) IMPLANT
PADDING CAST COTTON 3X4 STRL (CAST SUPPLIES) ×1
RUBBERBAND STERILE (MISCELLANEOUS) ×3 IMPLANT
SLEEVE SURGEON STRL (DRAPES) ×3 IMPLANT
SPLINT PLASTER CAST XFAST 4X15 (CAST SUPPLIES) IMPLANT
SPLINT PLASTER XTRA FAST SET 4 (CAST SUPPLIES)
STOCKINETTE 6  STRL (DRAPES) ×1
STOCKINETTE 6 STRL (DRAPES) ×2 IMPLANT
SUT VICRYL RAPIDE 4-0 (SUTURE) IMPLANT
SUT VICRYL RAPIDE 4/0 PS 2 (SUTURE) IMPLANT
SYR 10ML LL (SYRINGE) ×6 IMPLANT
SYR BULB 3OZ (MISCELLANEOUS) IMPLANT
TOWEL OR 17X24 6PK STRL BLUE (TOWEL DISPOSABLE) ×3 IMPLANT
TOWEL OR NON WOVEN STRL DISP B (DISPOSABLE) ×3 IMPLANT
UNDERPAD 30X30 (UNDERPADS AND DIAPERS) ×3 IMPLANT

## 2017-12-18 NOTE — Discharge Instructions (Signed)
Discharge Instructions   You have a dressing with a plaster splint incorporated in it. Move your fingers as much as possible, making a full fist and fully opening the fist. Elevate your hand to reduce pain & swelling of the digits.  Ice over the operative site or in the arm pit may be helpful to reduce pain & swelling.  DO NOT USE HEAT. Pain medicine has been prescribed for you.  Continue with Tylenol 650 mg and Ibuprofen 600 mg every 6 hours taken together. Tramadol has been prescribed for severe breakthrough pain as well as Zofran for nausea symptoms. Leave the dressing in place until you return to our office.  You may shower, but keep the bandage clean & dry.  You may drive a car when you are off of prescription pain medications and can safely control your vehicle with both hands. Call our office to arrange a follow up appointment in 10-15 days from the date of surgery.   Please call [phone] during normal business hours or [phone] after hours for any problems. Including the following:  - excessive redness of the incisions - drainage for more than 4 days - fever of more than 101.5 F  *Please note that pain medications will not be refilled after hours or on weekends.       Post Anesthesia Home Care Instructions  Activity: Get plenty of rest for the remainder of the day. A responsible individual must stay with you for 24 hours following the procedure.  For the next 24 hours, DO NOT: -Drive a car -Paediatric nurse -Drink alcoholic beverages -Take any medication unless instructed by your physician -Make any legal decisions or sign important papers.  Meals: Start with liquid foods such as gelatin or soup. Progress to regular foods as tolerated. Avoid greasy, spicy, heavy foods. If nausea and/or vomiting occur, drink only clear liquids until the nausea and/or vomiting subsides. Call your physician if vomiting continues.  Special Instructions/Symptoms: Your throat may  feel dry or sore from the anesthesia or the breathing tube placed in your throat during surgery. If this causes discomfort, gargle with warm salt water. The discomfort should disappear within 24 hours.  If you had a scopolamine patch placed behind your ear for the management of post- operative nausea and/or vomiting:  1. The medication in the patch is effective for 72 hours, after which it should be removed.  Wrap patch in a tissue and discard in the trash. Wash hands thoroughly with soap and water. 2. You may remove the patch earlier than 72 hours if you experience unpleasant side effects which may include dry mouth, dizziness or visual disturbances. 3. Avoid touching the patch. Wash your hands with soap and water after contact with the patch.

## 2017-12-18 NOTE — Interval H&P Note (Signed)
History and Physical Interval Note:  12/18/2017 7:28 AM  [name]  has presented today for surgery, with the diagnosis of RIGHT DISTAL RADIUS AND 5TH METACARPAL FRACTURES S52.551A  S62.316A  The various methods of treatment have been discussed with the patient and family. After consideration of risks, benefits and other options for treatment, the patient has consented to  Procedure(s): CLOSED REDUCTION AND PINNING OF RIGHT DISTAL RADIUS AND 5TH METACARPAL FRACTURES. AND RIGHT KNEE CORTISONE STEROID INJECTION (Right) (only possible pinning of distal radius) as a surgical intervention .  The patient's history has been reviewed, patient examined, no change in status, stable for surgery.  I have reviewed the patient's chart and labs.  Questions were answered to the patient's satisfaction.     Jolyn Nap

## 2017-12-18 NOTE — Anesthesia Procedure Notes (Signed)
Procedure Name: LMA Insertion Date/Time: 12/18/2017 7:39 AM Performed by: Marrianne Mood, CRNA Pre-anesthesia Checklist: Patient identified, Emergency Drugs available, Suction available, Patient being monitored and Timeout performed Patient Re-evaluated:Patient Re-evaluated prior to induction Oxygen Delivery Method: Circle system utilized Preoxygenation: Pre-oxygenation with 100% oxygen Induction Type: IV induction Ventilation: Mask ventilation without difficulty LMA: LMA inserted LMA Size: 4.0 Number of attempts: 1 Airway Equipment and Method: Bite block Placement Confirmation: positive ETCO2 Tube secured with: Tape Dental Injury: Teeth and Oropharynx as per pre-operative assessment

## 2017-12-18 NOTE — Anesthesia Postprocedure Evaluation (Signed)
Anesthesia Post Note  Patient: [name]  Procedure(s) Performed: CLOSED REDUCTION AND PINNING OF RIGHT DISTAL RADIUS AND 5TH METACARPAL FRACTURES (Right Arm Lower) Right KNEE CORTISONE INJECTION (Right Knee)     Patient location during evaluation: PACU Anesthesia Type: General Level of consciousness: awake and alert Pain management: pain level controlled Vital Signs Assessment: post-procedure vital signs reviewed and stable Respiratory status: spontaneous breathing, nonlabored ventilation, respiratory function stable and patient connected to nasal cannula oxygen Cardiovascular status: blood pressure returned to baseline and stable Postop Assessment: no apparent nausea or vomiting Anesthetic complications: no    Last Vitals:  Vitals:   12/18/17 0813 12/18/17 0815  BP: (!) 109/58 106/66  Pulse: 79 87  Resp: 16 20  Temp: 36.6 C   SpO2: 99% 100%    Last Pain:  Vitals:   12/18/17 0813  TempSrc:   PainSc: 0-No pain                 Masayo Fera

## 2017-12-18 NOTE — Op Note (Signed)
12/18/2017  7:31 AM  PATIENT:  [name]  67 y.o. female  PRE-OPERATIVE DIAGNOSIS:  R 5 CMC fx/subluxation, distal radius fx, & Knee arthritis  POST-OPERATIVE DIAGNOSIS:  Same  PROCEDURE:  CRPP R 5 MC base fx & intra-articular R knee steroid injection  SURGEON: Rayvon Char. Grandville Silos, MD  PHYSICIAN ASSISTANT: Morley Kos, OPA-C  ANESTHESIA:  general  SPECIMENS:  None  DRAINS:   None  EBL:  less than 50 mL  PREOPERATIVE INDICATIONS:  [name] is a  67 y.o. female with the above diagnoses, requiring operative care for particularly the 5 [facility] fx/subluxation  The risks benefits and alternatives were discussed with the patient preoperatively including but not limited to the risks of infection, bleeding, nerve injury, cardiopulmonary complications, the need for revision surgery, among others, and the patient verbalized understanding and consented to proceed.  OPERATIVE IMPLANTS: 0.045 inch K wires x2  OPERATIVE PROCEDURE:  After receiving prophylactic antibiotics, the patient was escorted to the operative theatre and placed in a supine position.  A surgical "time-out" was performed during which the planned procedure, proposed operative site, and the correct patient identity were compared to the operative consent and agreement confirmed by the circulating nurse according to current facility policy.  Following application of a tourniquet to the operative extremity, the exposed skin was prepped with Chloraprep and draped in the usual sterile fashion.  The tourniquet was not inflated.  The right knee was injected first after prepping the skin with alcohol, injecting a mixture of lidocaine and 1 mL of Depo-Medrol containing 40 mg.  A Band-Aid was applied.  It was injected from a superior lateral approach.  Attention was shifted to the hand.  The fifth metacarpal base fracture was manipulated under fluoroscopy and brought into acceptable gross alignment.  A K wire was driven obliquely from  the base of the fifth into the hamate.  It was placed through the major fragment.  A towel clip was inserted through a percutaneous puncture of the skin and used to manipulate the other small fragment of the fifth metacarpal, achieving better intra-articular alignment.  A transverse K wire was driven across the base, across the fourth, and into the third metacarpal.  The [facility] joint was found to be reduced, with minimal intra-articular irregularity.  The pins were bent over 90 at the skin and clipped.  Attention was shifted to assessment of the distal radius fracture, and under live fluoroscopy it was directly grasped and manipulated and there was no discernible movement whatsoever.  Decision was made not to proceed with pinning of the distal radius.  Half percent Marcaine was instilled about the pin sites as well as about the ulnar nerve at the wrist to affect postoperative pain control.  A short arm splint dressing was applied, padding the pins, containing a volar plaster slab.  The digits were exposed and freed from movement.  No malrotation of the small finger existed.  She was awakened and taken to the recovery room in stable condition, breathing spontaneously.  DISPOSITION: She will be discharged home today with typical instructions, returning in 10-15 days for reevaluation, with new x-rays of the right hand out of splint and conversion to a short arm cast.

## 2017-12-18 NOTE — Transfer of Care (Signed)
Immediate Anesthesia Transfer of Care Note  Patient: [name]  Procedure(s) Performed: CLOSED REDUCTION AND PINNING OF RIGHT DISTAL RADIUS AND 5TH METACARPAL FRACTURES (Right Arm Lower) Right KNEE CORTISONE INJECTION (Right Knee)  Patient Location: PACU  Anesthesia Type:General  Level of Consciousness: sedated  Airway & Oxygen Therapy: Patient Spontanous Breathing and Patient connected to face mask oxygen  Post-op Assessment: Report given to RN and Post -op Vital signs reviewed and stable  Post vital signs: Reviewed and stable  Last Vitals:  Vitals:   12/18/17 0635  BP: 104/73  Pulse: 66  Resp: 18  Temp: 36.5 C  SpO2: 98%    Last Pain:  Vitals:   12/18/17 0635  TempSrc: Oral  PainSc: 7          Complications: No apparent anesthesia complications

## 2017-12-19 ENCOUNTER — Encounter (HOSPITAL_BASED_OUTPATIENT_CLINIC_OR_DEPARTMENT_OTHER): Payer: Self-pay | Admitting: Orthopedic Surgery

## 2018-10-02 ENCOUNTER — Encounter: Payer: Self-pay | Admitting: Neurology

## 2018-10-04 ENCOUNTER — Other Ambulatory Visit: Payer: Self-pay

## 2018-10-04 ENCOUNTER — Ambulatory Visit: Payer: Medicare Other | Attending: Family Medicine

## 2018-10-04 DIAGNOSIS — R42 Dizziness and giddiness: Secondary | ICD-10-CM | POA: Diagnosis not present
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
## 2018-12-02 NOTE — Progress Notes (Signed)
NEUROLOGY CONSULTATION NOTE  [name] MRN: [id number] DOB: Aug 28, 1951  Referring provider: Melony Overly, MD Primary care provider: Jonathon Jordan, MD  Reason for consult:  dizziness  HISTORY OF PRESENT ILLNESS: [name] is a 68 year old right-handed woman who presents for dizziness.  History supplemented by PT and referring provider note.  In November, she woke up one morning with severe vertigo, described as spinning sensation.  She has had prior vertigo in the past which responded to Epley maneuver.  She tried the Epley maneuver which was ineffective.  She took meclizine which helped with the spinning but still felt "dizzy".  When she would walk, she was staggering to the left and sometimes to the right.  She fell and broke her wrist.  She felt nauseous.  No associated double vision, slurred speech, hearing loss, tinnitus, headache, dysphagia or unilateral numbness and weakness.  Prednisone and Nasacort was effective.  She started taking Allegra and nose spray, and symptoms resolved.  She went to [place] in December and developed dizziness again.  She was referred to PT for vestibular rehab.  Dix-Hallpike was negative and BPPV was not suspected.  She had a VNG on 11/15/18 which demonstrated findings of both peripheral and central pathologies.  Dizziness is now episodic.  She notices mostly when she first wakes up in the morning.  It persists but is aggravated by movement.  Dizziness and nausea lasts 1 to 2 minutes throughout the day. They would occur 1 to 2 weeks and with 1 week dizzy-free periods.  She hasn't had it in a week.  She has been packing as she is going to soon be moving to [place].    She has history of vertigo, but it never lasted this long and previously responded to Epley maneuver.  She also has history of migraines.  No increased migraines during this time.    CT of head from 05/14/15 was personally reviewed and was negative.  MRI of brain without contrast from 03/31/13 was  personally reviewed and demonstrated scattered nonspecific white matter changes but no acute finding.  PAST MEDICAL HISTORY: Past Medical History:  Diagnosis Date  . Abrasions of multiple sites 12/17/2017   right knee, bilateral hand  . Dental crowns present   . Distal radius fracture, right 12/17/2017  . History of bladder cancer 2014  . History of diverticulitis of colon   . History of kidney stones   . Hyperlipidemia    no current med.  . Immature cataract of both eyes   . Metacarpal bone fracture 12/17/2017   right  . Migraines   . Osteoporosis   . PONV (postoperative nausea and vomiting)   . Seasonal allergies     PAST SURGICAL HISTORY: Past Surgical History:  Procedure Laterality Date  . BLEPHAROPLASTY - BILATERAL    . BRAVO [place] STUDY N/A 08/24/2014   Procedure: BRAVO [place];  Surgeon: Lear Ng, MD;  [facility];  Service: Endoscopy;  Laterality: N/A;  . CESAREAN SECTION    . CLOSED REDUCTION METACARPAL WITH PERCUTANEOUS PINNING Right 12/18/2017   Procedure: CLOSED REDUCTION AND PINNING OF RIGHT DISTAL RADIUS AND 5TH METACARPAL FRACTURES;  Surgeon: Milly Jakob, MD;  Location: [facility];  Service: Orthopedics;  Laterality: Right;  . CYSTOSCOPY W/ RETROGRADES Bilateral 06/19/2013   Procedure: CYSTOSCOPY WITH RETROGRADE PYELOGRAM;  Surgeon: Molli Hazard, MD;  [facility];  Service: Urology;  Laterality: Bilateral;  . CYSTOSCOPY WITH RETROGRADE PYELOGRAM, URETEROSCOPY AND STENT PLACEMENT Right  04/21/2015   Procedure: CYSTOSCOPY WITH RIGHT  RETROGRADE PYELOGRAM, RIGHT DIAGNOSTIC  URETEROSCOPY AND STENT PLACEMENT;  Surgeon: Alexis Frock, MD;  Location: [facility];  Service: Urology;  Laterality: Right;  . CYSTOSCOPY WITH RETROGRADE PYELOGRAM, URETEROSCOPY AND STENT PLACEMENT Right 05/13/2015   Procedure: CYSTOSCOPY WITH RETROGRADE PYELOGRAM, URETEROSCOPY AND STENT EXCHANGE;  Surgeon: Alexis Frock, MD;   Location: [facility];  Service: Urology;  Laterality: Right;  . DILATION AND CURETTAGE OF UTERUS    . ELBOW ARTHROSCOPY Left 09/28/2006  . ESOPHAGOGASTRODUODENOSCOPY N/A 08/24/2014   Procedure: ESOPHAGOGASTRODUODENOSCOPY (EGD);  Surgeon: Lear Ng, MD;  Location[facility];  Service: Endoscopy;  Laterality: N/A;  . INJECTION KNEE Right 12/18/2017   Procedure: Right KNEE CORTISONE INJECTION;  Surgeon: Milly Jakob, MD;  Location: [facility];  Service: Orthopedics;  Laterality: Right;  . LAPAROSCOPIC SIGMOID COLECTOMY  2004  . ORIF ELBOW FRACTURE Left   . ORIF FOOT FRACTURE Left   . ORIF LEFT ELBOW    . RESECTION BONE TUMOR FOOT Right 02/2016  . STONE EXTRACTION WITH BASKET Right 05/13/2015   Procedure: STONE EXTRACTION WITH BASKET;  Surgeon: Alexis Frock, MD;  Location: [facility];  Service: Urology;  Laterality: Right;  . TRANSURETHRAL RESECTION OF BLADDER TUMOR WITH GYRUS (TURBT-GYRUS) N/A 06/19/2013   Procedure: TRANSURETHRAL RESECTION OF BLADDER TUMOR WITH GYRUS (TURBT-GYRUS);  Surgeon: Molli Hazard, MD;  [facility];  Service: Urology;  Laterality: N/A;  . TRANSURETHRAL RESECTION OF BLADDER TUMOR WITH GYRUS (TURBT-GYRUS) N/A 04/21/2015   Procedure: TRANSURETHRAL RESECTION OF BLADDER TUMOR WITH GYRUS (TURBT-GYRUS);  Surgeon: Alexis Frock, MD;  Location: [facility];  Service: Urology;  Laterality: N/A;    MEDICATIONS: Current Outpatient Medications on File Prior to Visit  Medication Sig Dispense Refill  . acetaminophen (TYLENOL) 325 MG tablet Take 650 mg by mouth every 6 (six) hours as needed.    . butalbital-aspirin-caffeine (FIORINAL) 50-325-40 MG capsule Take 1 capsule by mouth 2 (two) times daily as needed for headache.    . Calcium Carbonate-Vitamin D3 (CALCIUM 600-D) 600-400 MG-UNIT TABS Take by mouth.    . EPINEPHrine (EPIPEN 2-PAK) 0.3 mg/0.3 mL IJ SOAJ injection Inject into the muscle  once.    . fexofenadine (ALLEGRA) 180 MG tablet Take 180 mg by mouth daily.    . fluticasone (FLONASE) 50 MCG/ACT nasal spray Place into both nostrils daily.    Marland Kitchen ibuprofen (ADVIL,MOTRIN) 600 MG tablet Take 600 mg by mouth every 6 (six) hours as needed.    . meclizine (ANTIVERT) 25 MG tablet Take 25 mg by mouth 3 (three) times daily.    . Multiple Vitamin (MULTIVITAMIN) tablet Take 1 tablet by mouth daily.    Marland Kitchen olopatadine (PATANOL) 0.1 % ophthalmic solution Place 1 drop into both eyes 2 (two) times daily.    . ondansetron (ZOFRAN) 4 MG tablet Take 1 tablet (4 mg total) by mouth every 8 (eight) hours as needed for nausea or vomiting. (Patient not taking: Reported on 10/04/2018) 20 tablet 0  . Polyethyl Glycol-Propyl Glycol (SYSTANE) 0.4-0.3 % SOLN Apply to eye.    . predniSONE (STERAPRED UNI-PAK 48 TAB) 10 MG (48) TBPK tablet Take by mouth daily.    . traMADol (ULTRAM) 50 MG tablet Take 1 tablet (50 mg total) by mouth every 6 (six) hours as needed. (Patient not taking: Reported on 10/04/2018) 20 tablet 0  . zolpidem (AMBIEN) 10 MG tablet Take 5 mg by mouth at bedtime.  No current facility-administered medications on file prior to visit.     ALLERGIES: Allergies  Allergen Reactions  . Avelox [Moxifloxacin] Hives and Shortness Of Breath  . Hydrocodone-Acetaminophen Other (See Comments)    HALLUCINATIONS  . Pseudoephedrine Other (See Comments)    FEELS JITTERY  . Sulfa Antibiotics Hives and Itching    FAMILY HISTORY: Family History  Problem Relation Age of Onset  . Hypertension Brother   . Diabetes Brother   . Hyperlipidemia Brother   . Heart disease Brother   . Cancer Mother   . Heart disease Father   . Cancer Sister    SOCIAL HISTORY: Social History   Socioeconomic History  . Marital status: Married    Spouse name: Not on file  . Number of children: Not on file  . Years of education: Not on file  . Highest education level: Not on file  Occupational History  . Not on  file  Social Needs  . Financial resource strain: Not on file  . Food insecurity:    Worry: Not on file    Inability: Not on file  . Transportation needs:    Medical: Not on file    Non-medical: Not on file  Tobacco Use  . Smoking status: Never Smoker  . Smokeless tobacco: Never Used  Substance and Sexual Activity  . Alcohol use: No  . Drug use: No  . Sexual activity: Not on file  Lifestyle  . Physical activity:    Days per week: Not on file    Minutes per session: Not on file  . Stress: Not on file  Relationships  . Social connections:    Talks on phone: Not on file    Gets together: Not on file    Attends religious service: Not on file    Active member of club or organization: Not on file    Attends meetings of clubs or organizations: Not on file    Relationship status: Not on file  . Intimate partner violence:    Fear of current or ex partner: Not on file    Emotionally abused: Not on file    Physically abused: Not on file    Forced sexual activity: Not on file  Other Topics Concern  . Not on file  Social History Narrative  . Not on file    REVIEW OF SYSTEMS: Constitutional: No fevers, chills, or sweats, no generalized fatigue, change in appetite Eyes: No visual changes, double vision, eye pain Ear, nose and throat: No hearing loss, ear pain, nasal congestion, sore throat Cardiovascular: No chest pain, palpitations Respiratory:  No shortness of breath at rest or with exertion, wheezes GastrointestinaI: No nausea, vomiting, diarrhea, abdominal pain, fecal incontinence Genitourinary:  No dysuria, urinary retention or frequency Musculoskeletal:  No neck pain, back pain Integumentary: No rash, pruritus, skin lesions Neurological: as above Psychiatric: No depression, insomnia, anxiety Endocrine: No palpitations, fatigue, diaphoresis, mood swings, change in appetite, change in weight, increased thirst Hematologic/Lymphatic:  No purpura,  petechiae. Allergic/Immunologic: no itchy/runny eyes, nasal congestion, recent allergic reactions, rashes  PHYSICAL EXAM: Blood pressure 108/62, pulse 63, height 5\' 3"  (1.6 m), weight 123 lb (55.8 kg), SpO2 96 %. General: No acute distress.  Patient appears well-groomed.   Head:  Normocephalic/atraumatic Eyes:  fundi examined but not visualized Neck: supple, no paraspinal tenderness, full range of motion Back: No paraspinal tenderness Heart: regular rate and rhythm Lungs: Clear to auscultation bilaterally. Vascular: No carotid bruits. Neurological Exam: Mental status: alert and oriented to  person, place, and time, recent and remote memory intact, fund of knowledge intact, attention and concentration intact, speech fluent and not dysarthric, language intact. Cranial nerves: CN I: not tested CN II: pupils equal, round and reactive to light, visual fields intact CN III, IV, VI:  full range of motion, no nystagmus, no ptosis CN V: facial sensation intact CN VII: upper and lower face symmetric CN VIII: hearing intact CN IX, X: gag intact, uvula midline CN XI: sternocleidomastoid and trapezius muscles intact CN XII: tongue midline Bulk & Tone: normal, no fasciculations. Motor:  5/5 throughout  Sensation:  temperature and vibration sensation intact. Deep Tendon Reflexes:  2+ throughout, toes downgoing.   Finger to nose testing:  Without dysmetria.   Heel to shin:  Without dysmetria.   Gait:  Normal station and stride.  Able to turn and tandem walk. Romberg negative.  IMPRESSION: Vertigo and dizziness.  It has been ongoing for 3 months.  It has caused her difficulty with balance and has resulted in a fall.  VNG suggested central as well as peripheral origin.  She has a history of migraines but has not recently been suffering from them.  PLAN: 1.  Given that the dizziness this time has not subsided, I think imaging is warranted.  We will check MRI of brain and IAC with and without  contrast to evaluate for secondary etiology.  As she is moving next week, we will expedite this. 2.  She defers treatment at this time.  Thank you for allowing me to take part in the care of this patient.  Metta Clines, DO  CC:  Jonathon Jordan, MD  Melony Overly, MD

## 2018-12-04 ENCOUNTER — Encounter: Payer: Self-pay | Admitting: Neurology

## 2018-12-04 ENCOUNTER — Encounter

## 2018-12-04 ENCOUNTER — Ambulatory Visit: Payer: Medicare Other | Admitting: Neurology

## 2018-12-04 VITALS — BP 108/62 | HR 63 | Ht 63.0 in | Wt 123.0 lb

## 2018-12-04 DIAGNOSIS — W19XXXA Unspecified fall, initial encounter: Secondary | ICD-10-CM | POA: Diagnosis not present

## 2018-12-04 DIAGNOSIS — H814 Vertigo of central origin: Secondary | ICD-10-CM | POA: Diagnosis not present

## 2018-12-04 NOTE — Patient Instructions (Addendum)
We will check MRI of brain and internal auditory canals with and without contrast.   We have sent a referral to [facility] for your MRI and they will call you directly to schedule your appt. They are located at [place]. If you need to contact them directly please call [phone].

## 2018-12-05 ENCOUNTER — Ambulatory Visit
Admission: RE | Admit: 2018-12-05 | Discharge: 2018-12-05 | Disposition: A | Discharge status: Home / Self Care | Payer: Medicare Other | Source: Ambulatory Visit | Attending: Neurology | Admitting: Neurology

## 2018-12-05 DIAGNOSIS — H814 Vertigo of central origin: Secondary | ICD-10-CM

## 2018-12-05 DIAGNOSIS — W19XXXA Unspecified fall, initial encounter: Secondary | ICD-10-CM

## 2018-12-05 MED ORDER — GADOBENATE DIMEGLUMINE 529 MG/ML IV SOLN
10.0000 mL | Freq: Once | INTRAVENOUS | Status: AC | PRN
  Administered 2018-12-05: 10 mL via INTRAVENOUS

## 2018-12-06 ENCOUNTER — Other Ambulatory Visit: Payer: Medicare Other

## 2018-12-11 ENCOUNTER — Encounter: Payer: Self-pay | Admitting: *Deleted

## 2018-12-11 ENCOUNTER — Telehealth: Payer: Self-pay | Admitting: *Deleted

## 2018-12-11 NOTE — Telephone Encounter (Signed)
Results sent via My Chart.  

## 2018-12-11 NOTE — Telephone Encounter (Signed)
-----   Message from Pieter Partridge, DO sent at 12/11/2018  7:05 AM EST ----- MRI of brain unremarkable
